# Patient Record
Sex: Female | Born: 1955 | Race: Black or African American | Hispanic: No | Marital: Single | Smoking: Former smoker
Health system: Southern US, Community
[De-identification: ages and names within clinical notes are randomized; demographics above are authoritative.]

## PROBLEM LIST (undated history)

## (undated) DIAGNOSIS — I10 Essential (primary) hypertension: Secondary | ICD-10-CM

## (undated) DIAGNOSIS — E119 Type 2 diabetes mellitus without complications: Secondary | ICD-10-CM

## (undated) DIAGNOSIS — I639 Cerebral infarction, unspecified: Secondary | ICD-10-CM

## (undated) DIAGNOSIS — N289 Disorder of kidney and ureter, unspecified: Secondary | ICD-10-CM

## (undated) DIAGNOSIS — G0490 Encephalitis and encephalomyelitis, unspecified: Secondary | ICD-10-CM

## (undated) DIAGNOSIS — H548 Legal blindness, as defined in USA: Secondary | ICD-10-CM

## (undated) DIAGNOSIS — J45909 Unspecified asthma, uncomplicated: Secondary | ICD-10-CM

## (undated) HISTORY — PX: CHOLECYSTECTOMY: SHX55

---

## 2012-04-24 ENCOUNTER — Encounter (HOSPITAL_COMMUNITY): Payer: Self-pay | Admitting: *Deleted

## 2012-04-24 ENCOUNTER — Emergency Department (HOSPITAL_COMMUNITY)
Admission: EM | Admit: 2012-04-24 | Discharge: 2012-04-24 | Disposition: A | Discharge status: Home / Self Care | Payer: Medicaid Other | Source: Home / Self Care | Attending: Family Medicine | Admitting: Family Medicine

## 2012-04-24 DIAGNOSIS — L259 Unspecified contact dermatitis, unspecified cause: Secondary | ICD-10-CM

## 2012-04-24 HISTORY — DX: Essential (primary) hypertension: I10

## 2012-04-24 HISTORY — DX: Type 2 diabetes mellitus without complications: E11.9

## 2012-04-24 HISTORY — DX: Encephalitis and encephalomyelitis, unspecified: G04.90

## 2012-04-24 HISTORY — DX: Disorder of kidney and ureter, unspecified: N28.9

## 2012-04-24 MED ORDER — FLUTICASONE PROPIONATE 0.05 % EX CREA: TOPICAL_CREAM | Freq: Two times a day (BID) | CUTANEOUS | Status: DC

## 2012-04-24 MED ORDER — HYDROXYZINE HCL 25 MG PO TABS: 25.0000 mg | ORAL_TABLET | Freq: Four times a day (QID) | ORAL | Status: DC

## 2012-04-24 NOTE — ED Provider Notes (Signed)
History     CSN: [id number]  Arrival date & time 04/24/12  0903   First MD Initiated Contact with Patient 04/24/12 [phone]      Chief Complaint  Patient presents with  . Allergic Reaction    (Consider location/radiation/quality/duration/timing/severity/associated sxs/prior treatment) Patient is a 56 y.o. female presenting with allergic reaction. The history is provided by the patient and a caregiver.  Allergic Reaction The primary symptoms are  rash. The primary symptoms do not include wheezing, shortness of breath, cough, nausea, vomiting or diarrhea. The current episode started 2 days ago. The problem has not changed since onset. The rash is associated with itching.  The onset of the reaction was associated with a new medication (use of new hair product). Significant symptoms also include itching.    Past Medical History  Diagnosis Date  . Diabetes mellitus without complication   . Hypertension     History reviewed. No pertinent past surgical history.  No family history on file.  History  Substance Use Topics  . Smoking status: Not on file  . Smokeless tobacco: Not on file  . Alcohol Use:     OB History    Grav Para Term Preterm Abortions TAB SAB Ect Mult Living                  Review of Systems  Constitutional: Negative.   HENT: Negative for congestion, trouble swallowing, neck pain and neck stiffness.   Respiratory: Negative for cough, shortness of breath and wheezing.   Gastrointestinal: Negative for nausea, vomiting and diarrhea.  Skin: Positive for itching and rash.    Allergies  Review of patient's allergies indicates no known allergies.  Home Medications   Current Outpatient Rx  Name Route Sig Dispense Refill  . CLONIDINE HCL 0.1 MG/24HR TD PTWK Transdermal Place 1 patch onto the skin once a week.    Marland Kitchen CLONIDINE HCL 0.1 MG PO TABS Oral Take 0.1 mg by mouth 2 (two) times daily.    Marland Kitchen ESCITALOPRAM OXALATE 20 MG PO TABS Oral Take 20 mg by mouth daily.     Marland Kitchen FAMOTIDINE 20 MG PO TABS Oral Take 20 mg by mouth 2 (two) times daily.    . FUROSEMIDE 40 MG PO TABS Oral Take 40 mg by mouth daily.    Marland Kitchen HYDRALAZINE HCL 50 MG PO TABS Oral Take 50 mg by mouth 3 (three) times daily.    Marland Kitchen LANTUS [place] Subcutaneous Inject into the skin.    Marland Kitchen LISINOPRIL 5 MG PO TABS Oral Take 5 mg by mouth daily.    Marland Kitchen METAPROTERENOL SULFATE PO Oral Take by mouth.    . METFORMIN HCL 500 MG PO TABS Oral Take 500 mg by mouth 2 (two) times daily with a meal.    . POTASSIMIN PO Oral Take by mouth.    Marland Kitchen PROSOURCE PO Oral Take by mouth.    Marland Kitchen ZANTAC PO Oral Take by mouth.    Marland Kitchen FLUTICASONE PROPIONATE 0.05 % EX CREA Topical Apply topically 2 (two) times daily. To rash and eyelids. 30 g 1  . HYDROXYZINE HCL 25 MG PO TABS Oral Take 1 tablet (25 mg total) by mouth every 6 (six) hours. 20 tablet 0    BP 149/75  Pulse 90  Temp 97.6 F (36.4 C) (Oral)  Resp 20  SpO2 100%  Physical Exam  Nursing note and vitals reviewed. Constitutional: She appears well-developed and well-nourished. No distress.  HENT:  Head: Normocephalic.  Mouth/Throat: Oropharynx is clear  and moist.  Eyes: Conjunctivae normal are normal. Pupils are equal, round, and reactive to light.       bilat upper and lower eyelid sts, legally blind, nontender, no drainage.or erythema   Neck: Normal range of motion. Neck supple.  Cardiovascular: Normal rate, regular rhythm, normal heart sounds and intact distal pulses.   Pulmonary/Chest: Breath sounds normal.  Neurological: She is alert.  Skin: Skin is warm and dry.    ED Course  Procedures (including critical care time)  Labs Reviewed - No data to display No results found.   1. Contact dermatitis       MDM          Linna Hoff, MD 04/24/12 [phone]

## 2012-04-24 NOTE — ED Notes (Signed)
Pt  Reports     Had  Application  Of  Hair  Dye   2  Days  Ago  With  Facial  Edema   And  Swelling  Around  Eyes   Sister  Washed  Her  Hair  Out  Well      Gave  Her  Benadryl  Yet  Swelling  Continues            Shairway  Intact    No  resp  Distress  Pt  Is  Blind

## 2012-05-03 ENCOUNTER — Encounter (HOSPITAL_COMMUNITY): Payer: Self-pay

## 2012-05-03 ENCOUNTER — Emergency Department (HOSPITAL_COMMUNITY)
Admission: EM | Admit: 2012-05-03 | Discharge: 2012-05-03 | Disposition: A | Discharge status: Home / Self Care | Payer: Medicaid Other | Attending: Emergency Medicine | Admitting: Emergency Medicine

## 2012-05-03 DIAGNOSIS — Z794 Long term (current) use of insulin: Secondary | ICD-10-CM | POA: Insufficient documentation

## 2012-05-03 DIAGNOSIS — Z79899 Other long term (current) drug therapy: Secondary | ICD-10-CM | POA: Insufficient documentation

## 2012-05-03 DIAGNOSIS — Z8679 Personal history of other diseases of the circulatory system: Secondary | ICD-10-CM | POA: Insufficient documentation

## 2012-05-03 DIAGNOSIS — I129 Hypertensive chronic kidney disease with stage 1 through stage 4 chronic kidney disease, or unspecified chronic kidney disease: Secondary | ICD-10-CM | POA: Insufficient documentation

## 2012-05-03 DIAGNOSIS — Z87448 Personal history of other diseases of urinary system: Secondary | ICD-10-CM | POA: Insufficient documentation

## 2012-05-03 DIAGNOSIS — E119 Type 2 diabetes mellitus without complications: Secondary | ICD-10-CM | POA: Insufficient documentation

## 2012-05-03 DIAGNOSIS — L299 Pruritus, unspecified: Secondary | ICD-10-CM | POA: Insufficient documentation

## 2012-05-03 MED ORDER — FAMOTIDINE 20 MG PO TABS: 20.0000 mg | ORAL_TABLET | Freq: Two times a day (BID) | ORAL | Status: DC

## 2012-05-03 MED ORDER — DIPHENHYDRAMINE HCL 25 MG PO TABS: 25.0000 mg | ORAL_TABLET | Freq: Four times a day (QID) | ORAL | Status: DC

## 2012-05-03 MED ORDER — DIPHENHYDRAMINE HCL 50 MG/ML IJ SOLN
25.0000 mg | Freq: Once | INTRAMUSCULAR | Status: AC
  Administered 2012-05-03: 25 mg via INTRAMUSCULAR
  Filled 2012-05-03: qty 1

## 2012-05-03 MED ORDER — FAMOTIDINE 20 MG PO TABS
20.0000 mg | ORAL_TABLET | Freq: Once | ORAL | Status: AC
  Administered 2012-05-03: 20 mg via ORAL
  Filled 2012-05-03: qty 1

## 2012-05-03 NOTE — ED Notes (Signed)
Patient c/o rash from an allergic reaction from hair dye on 04/24/12. Patient was seen in the ED for the same and was prescribed fluticasone and atarax. Patient states she has been itching all over since. Patient continues to have slight facial swelling. Patient last took meds at 0830.

## 2012-05-03 NOTE — ED Provider Notes (Signed)
Medical screening examination/treatment/procedure(s) were performed by non-physician practitioner and as supervising physician I was immediately available for consultation/collaboration.   Marcelia Petersen, MD 05/03/12 1447 

## 2012-05-03 NOTE — ED Provider Notes (Signed)
History     CSN: [id number]  Arrival date & time 05/03/12  1123   First MD Initiated Contact with Patient 05/03/12 1214      Chief Complaint  Patient presents with  . Rash    (Consider location/radiation/quality/duration/timing/severity/associated sxs/prior treatment) HPI Comments: Patient is a a 56 year old female who presents with rash for the past week. The rash started gradually after using hair dye last week that precipitated allergic reaction which involved facial swelling and itching. Patient was seen at urgent care after the reaction where she was prescribed Atarax and fluticasone cream. The rash has remained constant since the onset. The rash is located all over her body. Patient has tried Aveeno body wash without relief. Patient denies new exposures to medications, soaps, lotions, detergent. Patient reports associated itching. No aggravating/alleviating factors. Patient denies fever, chills, NVD, sore throat, oral lesions, ocular involvement, throat closing, wheezing, SOB, chest pain, abdominal pain.     Patient is a 56 y.o. female presenting with rash.  Rash     Past Medical History  Diagnosis Date  . Diabetes mellitus without complication   . Hypertension   . Renal disease   . Encephalitis     History reviewed. No pertinent past surgical history.  History reviewed. No pertinent family history.  History  Substance Use Topics  . Smoking status: Former Games developer  . Smokeless tobacco: Never Used  . Alcohol Use: No    OB History    Grav Para Term Preterm Abortions TAB SAB Ect Mult Living                  Review of Systems  Skin: Positive for rash.  All other systems reviewed and are negative.    Allergies  Review of patient's allergies indicates no known allergies.  Home Medications   Current Outpatient Rx  Name Route Sig Dispense Refill  . CLONIDINE HCL 0.1 MG PO TABS Oral Take 0.1 mg by mouth 2 (two) times daily.    Marland Kitchen DICYCLOMINE HCL 20 MG PO TABS  Oral Take 20 mg by mouth every 6 (six) hours.    Marland Kitchen FLUTICASONE PROPIONATE 0.05 % EX CREA Topical Apply topically 2 (two) times daily. To rash and eyelids. 30 g 1  . FUROSEMIDE 40 MG PO TABS Oral Take 40 mg by mouth daily.    Marland Kitchen HYDRALAZINE HCL 25 MG PO TABS Oral Take 50 mg by mouth 3 (three) times daily.    Marland Kitchen HYDROXYZINE HCL 25 MG PO TABS Oral Take 1 tablet (25 mg total) by mouth every 6 (six) hours. 20 tablet 0  . LANTUS [place] Subcutaneous Inject 40 Units into the skin 2 (two) times daily.     . INSULIN LISPRO (HUMAN) 100 UNIT/ML [place] SOLN Subcutaneous Inject 4-6 Units into the skin 3 (three) times daily before meals.    Marland Kitchen METFORMIN HCL 500 MG PO TABS Oral Take 500 mg by mouth 2 (two) times daily with a meal.    . METOPROLOL TARTRATE 50 MG PO TABS Oral Take 50 mg by mouth 2 (two) times daily.    Marland Kitchen OMEPRAZOLE 40 MG PO CPDR Oral Take 40 mg by mouth daily.    Marland Kitchen POTASSIMIN PO Oral Take 20 mEq by mouth daily.       BP 140/73  Pulse 57  Temp 97.8 F (36.6 C) (Oral)  Resp 18  SpO2 99%  LMP 05/03/2012  Physical Exam  Nursing note and vitals reviewed. Constitutional: She is oriented to person,  place, and time. She appears well-developed and well-nourished. No distress.  HENT:  Head: Normocephalic and atraumatic.  Mouth/Throat: Oropharynx is clear and moist. No oropharyngeal exudate.  Eyes: Conjunctivae normal and EOM are normal. Pupils are equal, round, and reactive to light.  Neck: Normal range of motion. Neck supple.  Cardiovascular: Normal rate and regular rhythm.  Exam reveals no gallop and no friction rub.   No murmur heard. Pulmonary/Chest: Effort normal and breath sounds normal. She has no wheezes. She has no rales. She exhibits no tenderness.  Abdominal: Soft. She exhibits no distension. There is no tenderness.       No RUQ tenderness.   Musculoskeletal: Normal range of motion.  Neurological: She is alert and oriented to person, place, and time. Coordination normal.       Speech is  goal-oriented. Moves limbs without ataxia.   Skin: Skin is warm and dry. She is not diaphoretic.       No rash noted on body but patient has generalized dry skin and excoriations to arms, legs, back and stomach.   Psychiatric: She has a normal mood and affect. Her behavior is normal.    ED Course  Procedures (including critical care time)  Labs Reviewed - No data to display No results found.   1. Pruritus       MDM  1:27 PM Patient likely having continued generalized itching from hair dye exposure that she had an allergic reaction to one week ago. I will discharge her with benadryl and pepcid to take for the itching. Patient advised to follow up with PCP to have LFTs. Patient agreeable to plan. No wheezing, SOB, throat closing noted. No further evaluation needed at this time.        Emilia Beck, PA-C 05/03/12 1337

## 2012-05-11 ENCOUNTER — Encounter (HOSPITAL_COMMUNITY): Payer: Self-pay | Admitting: *Deleted

## 2012-05-11 ENCOUNTER — Emergency Department (HOSPITAL_COMMUNITY)
Admission: EM | Admit: 2012-05-11 | Discharge: 2012-05-11 | Disposition: A | Discharge status: Home / Self Care | Payer: Medicaid Other | Attending: Emergency Medicine | Admitting: Emergency Medicine

## 2012-05-11 DIAGNOSIS — E119 Type 2 diabetes mellitus without complications: Secondary | ICD-10-CM | POA: Insufficient documentation

## 2012-05-11 DIAGNOSIS — Z79899 Other long term (current) drug therapy: Secondary | ICD-10-CM | POA: Insufficient documentation

## 2012-05-11 DIAGNOSIS — I10 Essential (primary) hypertension: Secondary | ICD-10-CM | POA: Insufficient documentation

## 2012-05-11 DIAGNOSIS — L299 Pruritus, unspecified: Secondary | ICD-10-CM | POA: Insufficient documentation

## 2012-05-11 DIAGNOSIS — Z87891 Personal history of nicotine dependence: Secondary | ICD-10-CM | POA: Insufficient documentation

## 2012-05-11 DIAGNOSIS — Z794 Long term (current) use of insulin: Secondary | ICD-10-CM | POA: Insufficient documentation

## 2012-05-11 MED ORDER — HYDROXYZINE PAMOATE 25 MG PO CAPS: 25.0000 mg | ORAL_CAPSULE | Freq: Three times a day (TID) | ORAL | Status: DC | PRN

## 2012-05-11 MED ORDER — HYDROXYZINE HCL 25 MG PO TABS
25.0000 mg | ORAL_TABLET | Freq: Once | ORAL | Status: AC
  Administered 2012-05-11: 25 mg via ORAL
  Filled 2012-05-11: qty 1

## 2012-05-11 NOTE — ED Provider Notes (Signed)
History     CSN: [id number]  Arrival date & time 05/11/12  0244   First MD Initiated Contact with Patient 05/11/12 0255      Chief Complaint  Patient presents with  . Pruritis    (Consider location/radiation/quality/duration/timing/severity/associated sxs/prior treatment) The history is provided by the patient and a relative. No language interpreter was used.   Cc: 56 year-old female coming in today with complaint of puritis. Patient had allergic reaction to hair dye on 10:15. Since then she has been seen twice for chronic itching. States that the Benadryl and lotions or not working anymore. States that the Vistaril works a little better than the Benadryl requesting another prescription for Vistaril. No visible rash.  Sister states that the swelling around her eyes has improved.     Past Medical History  Diagnosis Date  . Diabetes mellitus without complication   . Hypertension   . Renal disease   . Encephalitis     History reviewed. No pertinent past surgical history.  No family history on file.  History  Substance Use Topics  . Smoking status: Former Games developer  . Smokeless tobacco: Never Used  . Alcohol Use: No    OB History    Grav Para Term Preterm Abortions TAB SAB Ect Mult Living                  Review of Systems  HENT: Negative for facial swelling and neck pain.   Eyes: Negative for visual disturbance.  Cardiovascular: Negative for chest pain.  Gastrointestinal: Negative for nausea, vomiting and abdominal pain.  Musculoskeletal: Negative for back pain and gait problem.  Skin: Negative for wound.  Neurological: Negative for dizziness, weakness, numbness and headaches.  All other systems reviewed and are negative.    Allergies  Review of patient's allergies indicates no known allergies.  Home Medications   Current Outpatient Rx  Name Route Sig Dispense Refill  . CLONIDINE HCL 0.1 MG PO TABS Oral Take 0.1 mg by mouth 2 (two) times daily.    Marland Kitchen  DICYCLOMINE HCL 20 MG PO TABS Oral Take 20 mg by mouth every 6 (six) hours.    Marland Kitchen DIPHENHYDRAMINE HCL 25 MG PO TABS Oral Take 1 tablet (25 mg total) by mouth every 6 (six) hours. 20 tablet 0  . INSULIN GLARGINE 100 UNIT/ML [place] SOLN Subcutaneous Inject 40 Units into the skin 2 (two) times daily.    Marland Kitchen POTASSIUM CHLORIDE [place] ER 20 MEQ PO TBCR Oral Take 20 mEq by mouth daily.    Marland Kitchen FAMOTIDINE 20 MG PO TABS Oral Take 1 tablet (20 mg total) by mouth 2 (two) times daily. 30 tablet 0  . FLUTICASONE PROPIONATE 0.05 % EX CREA Topical Apply topically 2 (two) times daily. To rash and eyelids. 30 g 1  . FUROSEMIDE 40 MG PO TABS Oral Take 40 mg by mouth daily.    Marland Kitchen HYDRALAZINE HCL 25 MG PO TABS Oral Take 50 mg by mouth 3 (three) times daily.    Marland Kitchen HYDROXYZINE HCL 25 MG PO TABS Oral Take 1 tablet (25 mg total) by mouth every 6 (six) hours. 20 tablet 0  . INSULIN LISPRO (HUMAN) 100 UNIT/ML [place] SOLN Subcutaneous Inject 4-6 Units into the skin 3 (three) times daily before meals.    Marland Kitchen METFORMIN HCL 500 MG PO TABS Oral Take 500 mg by mouth 2 (two) times daily with a meal.    . METOPROLOL TARTRATE 50 MG PO TABS Oral Take 50 mg by mouth  2 (two) times daily.    Marland Kitchen OMEPRAZOLE 40 MG PO CPDR Oral Take 40 mg by mouth daily.      BP 137/69  Pulse 68  Temp 97.9 F (36.6 C)  Resp 20  Ht 5\' 1"  (1.549 m)  Wt 208 lb (94.348 kg)  BMI 39.30 kg/m2  SpO2 95%  LMP 05/03/2012  Physical Exam  Nursing note and vitals reviewed. Constitutional: She is oriented to person, place, and time. She appears well-developed and well-nourished.  HENT:  Head: Normocephalic and atraumatic.  Eyes: Conjunctivae normal and EOM are normal. Pupils are equal, round, and reactive to light.  Neck: Normal range of motion. Neck supple.  Cardiovascular: Normal rate.   Pulmonary/Chest: Effort normal.  Abdominal: Soft.  Musculoskeletal: Normal range of motion. She exhibits no edema and no tenderness.  Neurological: She is alert and oriented to person,  place, and time. She has normal reflexes.  Skin: Skin is warm and dry. No rash noted.       pruritis  Psychiatric: She has a normal mood and affect.    ED Course  Procedures (including critical care time)  Labs Reviewed - No data to display No results found.   No diagnosis found.    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 Out of her  Vistaril. No visible rash. Prescription for Vistaril and followup with Dr. Margo Aye dermatology tomorrow if possible. Patient will followup with her PCP on the seventh. No acute distress.          Remi Haggard, NP 05/11/12 [phone]

## 2012-05-11 NOTE — ED Notes (Signed)
Pt had an allergic reaction to hair dye on 10/11; treated on 10/13; got a little better but since then has continued with all over body itching; treated for same on 10/24 with cream; continues with itching

## 2012-05-12 NOTE — ED Provider Notes (Signed)
Medical screening examination/treatment/procedure(s) were performed by non-physician practitioner and as supervising physician I was immediately available for consultation/collaboration.    Vida Roller, MD 05/12/12 [phone]

## 2014-07-23 ENCOUNTER — Other Ambulatory Visit: Payer: Self-pay | Admitting: Family Medicine

## 2014-07-23 DIAGNOSIS — Z1231 Encounter for screening mammogram for malignant neoplasm of breast: Secondary | ICD-10-CM

## 2014-08-05 ENCOUNTER — Ambulatory Visit: Payer: Medicaid Other

## 2014-08-19 ENCOUNTER — Ambulatory Visit
Admission: RE | Admit: 2014-08-19 | Discharge: 2014-08-19 | Disposition: A | Discharge status: Home / Self Care | Payer: No Typology Code available for payment source | Source: Ambulatory Visit | Attending: Family Medicine | Admitting: Family Medicine

## 2014-08-19 DIAGNOSIS — Z1231 Encounter for screening mammogram for malignant neoplasm of breast: Secondary | ICD-10-CM

## 2016-09-21 ENCOUNTER — Ambulatory Visit (INDEPENDENT_AMBULATORY_CARE_PROVIDER_SITE_OTHER): Payer: Medicare Other | Admitting: Sports Medicine

## 2016-09-21 ENCOUNTER — Encounter: Payer: Self-pay | Admitting: Sports Medicine

## 2016-09-21 ENCOUNTER — Ambulatory Visit: Payer: Medicare Other | Admitting: Podiatry

## 2016-09-21 DIAGNOSIS — E119 Type 2 diabetes mellitus without complications: Secondary | ICD-10-CM | POA: Diagnosis not present

## 2016-09-21 DIAGNOSIS — B351 Tinea unguium: Secondary | ICD-10-CM

## 2016-09-21 DIAGNOSIS — M79676 Pain in unspecified toe(s): Secondary | ICD-10-CM | POA: Diagnosis not present

## 2016-09-21 NOTE — Patient Instructions (Signed)

## 2016-09-21 NOTE — Progress Notes (Signed)
Subjective: Kristen Dennis is a 61 y.o. female patient with history of diabetes who presents to office today complaining of long, painful nails  while ambulating in shoes; unable to trim. Patient states that the glucose reading this morning was 87 mg/dl [facility]. Patient denies any new changes in medication or new problems. Patient denies any new cramping, numbness, burning or tingling in the legs.  There are no active problems to display for this patient.  Current Outpatient Prescriptions on File Prior to Visit  Medication Sig Dispense Refill  . cloNIDine (CATAPRES) 0.1 MG tablet Take 0.1 mg by mouth 2 (two) times daily.    Marland Kitchen. dicyclomine (BENTYL) 20 MG tablet Take 20 mg by mouth every 6 (six) hours.    . diphenhydrAMINE (BENADRYL) 25 MG tablet Take 1 tablet (25 mg total) by mouth every 6 (six) hours. 20 tablet 0  . famotidine (PEPCID) 20 MG tablet Take 1 tablet (20 mg total) by mouth 2 (two) times daily. 30 tablet 0  . fluticasone (CUTIVATE) 0.05 % cream Apply topically 2 (two) times daily. To rash and eyelids. 30 g 1  . furosemide (LASIX) 40 MG tablet Take 40 mg by mouth daily.    . hydrALAZINE (APRESOLINE) 25 MG tablet Take 50 mg by mouth 3 (three) times daily.    . hydrOXYzine (ATARAX/VISTARIL) 25 MG tablet Take 1 tablet (25 mg total) by mouth every 6 (six) hours. 20 tablet 0  . hydrOXYzine (VISTARIL) 25 MG capsule Take 1 capsule (25 mg total) by mouth 3 (three) times daily as needed for itching. 30 capsule 0  . insulin glargine (LANTUS) 100 UNIT/ML injection Inject 40 Units into the skin 2 (two) times daily.    . insulin lispro (HUMALOG) 100 UNIT/ML injection Inject 4-6 Units into the skin 3 (three) times daily before meals.    . metFORMIN (GLUCOPHAGE) 500 MG tablet Take 500 mg by mouth 2 (two) times daily with a meal.    . metoprolol (LOPRESSOR) 50 MG tablet Take 50 mg by mouth 2 (two) times daily.    Marland Kitchen. omeprazole (PRILOSEC) 40 MG capsule Take 40 mg by mouth  daily.    . potassium chloride SA (K-DUR,KLOR-CON) 20 MEQ tablet Take 20 mEq by mouth daily.     No current facility-administered medications on file prior to visit.    No Known Allergies  No results found for this or any previous visit (from the past 2160 hour(s)).  Objective: General: Patient is awake, alert, and oriented x 3 and in no acute distress.  Integument: Skin is warm, dry and supple bilateral. Nails are tender, long, thickened and dystrophic with subungual debris, consistent with onychomycosis, 1-5 bilateral. No signs of infection. No open lesions or preulcerative lesions present bilateral. Remaining integument unremarkable.  Vasculature:  Dorsalis Pedis pulse 1/4 bilateral. Posterior Tibial pulse  1/4 bilateral. Capillary fill time <3 sec 1-5 bilateral. Scant hair growth to the level of the digits.Temperature gradient within normal limits. No varicosities present bilateral. No edema present bilateral.   Neurology: The patient has intact sensation measured with a 5.07/10g Semmes Weinstein Monofilament at all pedal sites bilateral . Vibratory sensation diminished bilateral with tuning fork. No Babinski sign present bilateral.   Musculoskeletal: No symptomatic pedal deformities noted bilateral. Muscular strength 5/5 in all lower extremity muscular groups bilateral without pain on range of motion . No tenderness with calf compression bilateral.  Assessment and Plan: Problem List Items Addressed This Visit    None  Visit Diagnoses    Pain due to onychomycosis of toenail    -  Primary   Diabetes mellitus without complication (HCC)          -Examined patient. -Discussed and educated patient on diabetic foot care, especially with  regards to the vascular, neurological and musculoskeletal systems.  -Stressed the importance of good glycemic control and the detriment of not controlling glucose levels in relation to the foot. -Mechanically debrided all nails 1-5 bilateral using  sterile nail nipper and filed with dremel without incident  -Continue with wheelchair assistance gait  -Answered all patient questions -Patient to return in 3 months for at risk foot care -Patient advised to call the office if any problems or questions arise in the meantime.  Asencion Islam, DPM

## 2016-12-21 ENCOUNTER — Ambulatory Visit: Payer: Medicare Other | Admitting: Sports Medicine

## 2017-07-15 ENCOUNTER — Encounter (HOSPITAL_COMMUNITY): Payer: Self-pay

## 2017-07-15 ENCOUNTER — Other Ambulatory Visit: Payer: Self-pay

## 2017-07-15 ENCOUNTER — Inpatient Hospital Stay (HOSPITAL_COMMUNITY)
Admission: EM | Admit: 2017-07-15 | Discharge: 2017-07-18 | DRG: 202 | Disposition: A | Discharge status: Home Health | Payer: Medicare Other | Attending: Nephrology | Admitting: Nephrology

## 2017-07-15 ENCOUNTER — Emergency Department (HOSPITAL_COMMUNITY): Payer: Medicare Other

## 2017-07-15 DIAGNOSIS — Z9049 Acquired absence of other specified parts of digestive tract: Secondary | ICD-10-CM

## 2017-07-15 DIAGNOSIS — Z6841 Body Mass Index (BMI) 40.0 and over, adult: Secondary | ICD-10-CM

## 2017-07-15 DIAGNOSIS — N289 Disorder of kidney and ureter, unspecified: Secondary | ICD-10-CM

## 2017-07-15 DIAGNOSIS — E1165 Type 2 diabetes mellitus with hyperglycemia: Secondary | ICD-10-CM | POA: Diagnosis present

## 2017-07-15 DIAGNOSIS — Z79899 Other long term (current) drug therapy: Secondary | ICD-10-CM

## 2017-07-15 DIAGNOSIS — E119 Type 2 diabetes mellitus without complications: Secondary | ICD-10-CM | POA: Diagnosis not present

## 2017-07-15 DIAGNOSIS — Z794 Long term (current) use of insulin: Secondary | ICD-10-CM

## 2017-07-15 DIAGNOSIS — J4531 Mild persistent asthma with (acute) exacerbation: Principal | ICD-10-CM

## 2017-07-15 DIAGNOSIS — J45901 Unspecified asthma with (acute) exacerbation: Secondary | ICD-10-CM | POA: Diagnosis present

## 2017-07-15 DIAGNOSIS — I10 Essential (primary) hypertension: Secondary | ICD-10-CM | POA: Diagnosis not present

## 2017-07-15 DIAGNOSIS — J40 Bronchitis, not specified as acute or chronic: Secondary | ICD-10-CM

## 2017-07-15 DIAGNOSIS — E669 Obesity, unspecified: Secondary | ICD-10-CM | POA: Diagnosis present

## 2017-07-15 DIAGNOSIS — J45909 Unspecified asthma, uncomplicated: Secondary | ICD-10-CM | POA: Diagnosis present

## 2017-07-15 DIAGNOSIS — T380X5A Adverse effect of glucocorticoids and synthetic analogues, initial encounter: Secondary | ICD-10-CM | POA: Diagnosis present

## 2017-07-15 DIAGNOSIS — E1159 Type 2 diabetes mellitus with other circulatory complications: Secondary | ICD-10-CM | POA: Diagnosis present

## 2017-07-15 DIAGNOSIS — R0902 Hypoxemia: Secondary | ICD-10-CM | POA: Diagnosis present

## 2017-07-15 DIAGNOSIS — I152 Hypertension secondary to endocrine disorders: Secondary | ICD-10-CM | POA: Diagnosis present

## 2017-07-15 DIAGNOSIS — Z87891 Personal history of nicotine dependence: Secondary | ICD-10-CM

## 2017-07-15 HISTORY — DX: Unspecified asthma, uncomplicated: J45.909

## 2017-07-15 HISTORY — DX: Cerebral infarction, unspecified: I63.9

## 2017-07-15 LAB — BASIC METABOLIC PANEL
ANION GAP: 9 (ref 5–15)
BUN: 10 mg/dL (ref 6–20)
CHLORIDE: 103 mmol/L (ref 101–111)
CO2: 25 mmol/L (ref 22–32)
CREATININE: 0.92 mg/dL (ref 0.44–1.00)
Calcium: 8.9 mg/dL (ref 8.9–10.3)
GFR calc non Af Amer: >60 mL/min (ref >60)
Glucose, Bld: 105 mg/dL — ABNORMAL HIGH (ref 65–99)
POTASSIUM: 4.7 mmol/L (ref 3.5–5.1)
SODIUM: 137 mmol/L (ref 135–145)

## 2017-07-15 LAB — GLUCOSE, CAPILLARY: GLUCOSE-CAPILLARY: 253 mg/dL — AB (ref 65–99)

## 2017-07-15 LAB — CBC
HEMATOCRIT: 42.9 % (ref 36.0–46.0)
Hemoglobin: 13.9 g/dL (ref 12.0–15.0)
MCH: 27.4 pg (ref 26.0–34.0)
MCHC: 32.4 g/dL (ref 30.0–36.0)
MCV: 84.6 fL (ref 78.0–100.0)
Platelets: 252 10*3/uL (ref 150–400)
RBC: 5.07 MIL/uL (ref 3.87–5.11)
RDW: 16.1 % — AB (ref 11.5–15.5)
WBC: 8.8 10*3/uL (ref 4.0–10.5)

## 2017-07-15 MED ORDER — SODIUM CHLORIDE 0.9% FLUSH
3.0000 mL | Freq: Two times a day (BID) | INTRAVENOUS | Status: DC
  Administered 2017-07-16 (×3): 3 mL via INTRAVENOUS
  Administered 2017-07-17: 23:00:00 via INTRAVENOUS
  Administered 2017-07-18: 10:00:00 3 mL via INTRAVENOUS

## 2017-07-15 MED ORDER — IPRATROPIUM-ALBUTEROL 0.5-2.5 (3) MG/3ML IN SOLN
3.0000 mL | Freq: Four times a day (QID) | RESPIRATORY_TRACT | Status: DC
  Administered 2017-07-16 – 2017-07-17 (×8): 3 mL via RESPIRATORY_TRACT
  Filled 2017-07-15 (×8): qty 3

## 2017-07-15 MED ORDER — AMLODIPINE BESYLATE 5 MG PO TABS
5.0000 mg | ORAL_TABLET | Freq: Every day | ORAL | Status: DC
  Administered 2017-07-16 – 2017-07-18 (×3): 5 mg via ORAL
  Filled 2017-07-15 (×3): qty 1

## 2017-07-15 MED ORDER — SODIUM CHLORIDE 0.9 % IV SOLN: 250.0000 mL | INTRAVENOUS | Status: DC | PRN

## 2017-07-15 MED ORDER — LISINOPRIL 10 MG PO TABS
10.0000 mg | ORAL_TABLET | Freq: Every day | ORAL | Status: DC
  Administered 2017-07-16 – 2017-07-17 (×2): 10 mg via ORAL
  Filled 2017-07-15 (×2): qty 1

## 2017-07-15 MED ORDER — GUAIFENESIN ER 600 MG PO TB12
600.0000 mg | ORAL_TABLET | Freq: Two times a day (BID) | ORAL | Status: DC
  Administered 2017-07-16 – 2017-07-18 (×6): 600 mg via ORAL
  Filled 2017-07-15 (×6): qty 1

## 2017-07-15 MED ORDER — IPRATROPIUM BROMIDE 0.02 % IN SOLN
0.5000 mg | Freq: Once | RESPIRATORY_TRACT | Status: AC
  Administered 2017-07-15: 0.5 mg via RESPIRATORY_TRACT
  Filled 2017-07-15: qty 2.5

## 2017-07-15 MED ORDER — PREDNISONE 20 MG PO TABS
60.0000 mg | ORAL_TABLET | Freq: Once | ORAL | Status: AC
  Administered 2017-07-15: 60 mg via ORAL
  Filled 2017-07-15: qty 3

## 2017-07-15 MED ORDER — FUROSEMIDE 40 MG PO TABS
40.0000 mg | ORAL_TABLET | Freq: Every day | ORAL | Status: DC
  Administered 2017-07-16 – 2017-07-18 (×3): 40 mg via ORAL
  Filled 2017-07-15 (×3): qty 1

## 2017-07-15 MED ORDER — HYDRALAZINE HCL 50 MG PO TABS
50.0000 mg | ORAL_TABLET | Freq: Three times a day (TID) | ORAL | Status: DC
  Administered 2017-07-16 – 2017-07-18 (×6): 50 mg via ORAL
  Filled 2017-07-15 (×5): qty 1

## 2017-07-15 MED ORDER — ALBUTEROL SULFATE (2.5 MG/3ML) 0.083% IN NEBU
2.5000 mg | INHALATION_SOLUTION | RESPIRATORY_TRACT | Status: DC | PRN
  Administered 2017-07-16: 22:00:00 2.5 mg via RESPIRATORY_TRACT
  Filled 2017-07-15: qty 3

## 2017-07-15 MED ORDER — DULOXETINE HCL 30 MG PO CPEP
60.0000 mg | ORAL_CAPSULE | Freq: Every day | ORAL | Status: DC
  Administered 2017-07-16 – 2017-07-17 (×3): 60 mg via ORAL
  Filled 2017-07-15 (×3): qty 2

## 2017-07-15 MED ORDER — METOPROLOL TARTRATE 25 MG PO TABS
50.0000 mg | ORAL_TABLET | Freq: Two times a day (BID) | ORAL | Status: DC
  Administered 2017-07-16 – 2017-07-18 (×5): 50 mg via ORAL
  Filled 2017-07-15 (×5): qty 2

## 2017-07-15 MED ORDER — PREGABALIN 50 MG PO CAPS
100.0000 mg | ORAL_CAPSULE | Freq: Three times a day (TID) | ORAL | Status: DC
  Administered 2017-07-16 – 2017-07-18 (×8): 100 mg via ORAL
  Filled 2017-07-15 (×8): qty 2

## 2017-07-15 MED ORDER — MONTELUKAST SODIUM 10 MG PO TABS
10.0000 mg | ORAL_TABLET | Freq: Every day | ORAL | Status: DC
  Administered 2017-07-16 – 2017-07-17 (×3): 10 mg via ORAL
  Filled 2017-07-15 (×3): qty 1

## 2017-07-15 MED ORDER — ALBUTEROL SULFATE (2.5 MG/3ML) 0.083% IN NEBU
5.0000 mg | INHALATION_SOLUTION | Freq: Once | RESPIRATORY_TRACT | Status: AC
  Administered 2017-07-15: 5 mg via RESPIRATORY_TRACT
  Filled 2017-07-15: qty 6

## 2017-07-15 MED ORDER — IPRATROPIUM BROMIDE 0.02 % IN SOLN: 0.5000 mg | Freq: Four times a day (QID) | RESPIRATORY_TRACT | Status: DC

## 2017-07-15 MED ORDER — ALBUTEROL SULFATE (2.5 MG/3ML) 0.083% IN NEBU
2.5000 mg | INHALATION_SOLUTION | Freq: Once | RESPIRATORY_TRACT | Status: AC
  Administered 2017-07-15: 2.5 mg via RESPIRATORY_TRACT
  Filled 2017-07-15: qty 3

## 2017-07-15 MED ORDER — SODIUM CHLORIDE 0.9% FLUSH: 3.0000 mL | INTRAVENOUS | Status: DC | PRN

## 2017-07-15 MED ORDER — ALBUTEROL (5 MG/ML) CONTINUOUS INHALATION SOLN
10.0000 mg/h | INHALATION_SOLUTION | Freq: Once | RESPIRATORY_TRACT | Status: AC
Start: 2017-07-15 — End: 2017-07-15
  Administered 2017-07-15: 10 mg/h via RESPIRATORY_TRACT
  Filled 2017-07-15: qty 20

## 2017-07-15 MED ORDER — AZITHROMYCIN 250 MG PO TABS
250.0000 mg | ORAL_TABLET | Freq: Every day | ORAL | Status: DC
  Administered 2017-07-16 – 2017-07-17 (×2): 250 mg via ORAL
  Filled 2017-07-15 (×2): qty 1

## 2017-07-15 MED ORDER — ALBUTEROL SULFATE (2.5 MG/3ML) 0.083% IN NEBU: 2.5000 mg | INHALATION_SOLUTION | Freq: Four times a day (QID) | RESPIRATORY_TRACT | Status: DC

## 2017-07-15 MED ORDER — INSULIN GLARGINE 100 UNIT/ML ~~LOC~~ SOLN
55.0000 [IU] | Freq: Every day | SUBCUTANEOUS | Status: DC
  Administered 2017-07-16 – 2017-07-17 (×3): 55 [IU] via SUBCUTANEOUS
  Filled 2017-07-15 (×4): qty 0.55

## 2017-07-15 MED ORDER — METHYLPREDNISOLONE SODIUM SUCC 125 MG IJ SOLR
80.0000 mg | Freq: Two times a day (BID) | INTRAMUSCULAR | Status: DC
  Administered 2017-07-16 – 2017-07-18 (×6): 80 mg via INTRAVENOUS
  Filled 2017-07-15 (×6): qty 2

## 2017-07-15 MED ORDER — AZITHROMYCIN 250 MG PO TABS
500.0000 mg | ORAL_TABLET | ORAL | Status: AC
  Administered 2017-07-16: 01:00:00 500 mg via ORAL
  Filled 2017-07-15: qty 2

## 2017-07-15 MED ORDER — INSULIN ASPART 100 UNIT/ML ~~LOC~~ SOLN
0.0000 [IU] | Freq: Three times a day (TID) | SUBCUTANEOUS | Status: DC
Start: 2017-07-16 — End: 2017-07-18
  Administered 2017-07-16: 7 [IU] via SUBCUTANEOUS
  Administered 2017-07-16: 09:00:00 5 [IU] via SUBCUTANEOUS
  Administered 2017-07-16: 7 [IU] via SUBCUTANEOUS
  Administered 2017-07-17: 13:00:00 5 [IU] via SUBCUTANEOUS
  Administered 2017-07-17: 17:00:00 7 [IU] via SUBCUTANEOUS
  Administered 2017-07-17: 3 [IU] via SUBCUTANEOUS
  Administered 2017-07-18 (×2): 5 [IU] via SUBCUTANEOUS

## 2017-07-15 MED ORDER — DONEPEZIL HCL 5 MG PO TABS
5.0000 mg | ORAL_TABLET | Freq: Every day | ORAL | Status: DC
  Administered 2017-07-16 – 2017-07-17 (×3): 5 mg via ORAL
  Filled 2017-07-15 (×3): qty 1

## 2017-07-15 MED ORDER — POTASSIUM CHLORIDE CRYS ER 20 MEQ PO TBCR
20.0000 meq | EXTENDED_RELEASE_TABLET | Freq: Every day | ORAL | Status: DC
  Administered 2017-07-16 – 2017-07-18 (×2): 20 meq via ORAL
  Filled 2017-07-15 (×3): qty 1

## 2017-07-15 NOTE — H&P (Signed)
History and Physical    Kristen Dennis FAO:[id number] DOB: Feb 07, 1956 DOA: 07/15/2017  PCP: Claudean Severance, MD  Patient coming from:  home  Chief Complaint:  Wheezing, sob  HPI: Kristen Dennis is a 62 y.o. female with medical history significant of asthma, dm, htn comes in with a week of worsening sob and wheezing not getting better at home with her expired albuterol inhaler.  She has been having nasal congestion and recent cold.  No fevers.  No le edema or swelling.  No chest pain.  Pt given several nebs in the ED and still wheezing.  On ambulation oxygen sats are 88% on RA.  Review of Systems: As per HPI otherwise 10 point review of systems negative.   Past Medical History:  Diagnosis Date  . Asthma   . Diabetes mellitus without complication (HCC)   . Encephalitis   . Hypertension   . Renal disease     Past Surgical History:  Procedure Laterality Date  . CHOLECYSTECTOMY       reports that she has quit smoking. she has never used smokeless tobacco. She reports that she does not drink alcohol or use drugs.  No Known Allergies  History reviewed. No pertinent family history.  No premature CAD  Prior to Admission medications   Medication Sig Start Date End Date Taking? Authorizing Provider  cloNIDine (CATAPRES) 0.1 MG tablet Take 0.1 mg by mouth 2 (two) times daily.    [provider]  dicyclomine (BENTYL) 20 MG tablet Take 20 mg by mouth every 6 (six) hours.    [provider]  diphenhydrAMINE (BENADRYL) 25 MG tablet Take 1 tablet (25 mg total) by mouth every 6 (six) hours. 05/03/12   Szekalski, Yvonna Alanis, PA-C  famotidine (PEPCID) 20 MG tablet Take 1 tablet (20 mg total) by mouth 2 (two) times daily. 05/03/12   Szekalski, Kaitlyn, PA-C  fluticasone (CUTIVATE) 0.05 % cream Apply topically 2 (two) times daily. To rash and eyelids. 04/24/12   Linna Hoff, MD  furosemide (LASIX) 40 MG tablet Take 40 mg by mouth daily.    [provider]  hydrALAZINE  (APRESOLINE) 25 MG tablet Take 50 mg by mouth 3 (three) times daily.    [provider]  hydrOXYzine (ATARAX/VISTARIL) 25 MG tablet Take 1 tablet (25 mg total) by mouth every 6 (six) hours. 04/24/12   Linna Hoff, MD  hydrOXYzine (VISTARIL) 25 MG capsule Take 1 capsule (25 mg total) by mouth 3 (three) times daily as needed for itching. 05/11/12   Jethro Bastos, NP  insulin glargine (LANTUS) 100 UNIT/ML injection Inject 40 Units into the skin 2 (two) times daily.    [provider]  insulin lispro (HUMALOG) 100 UNIT/ML injection Inject 4-6 Units into the skin 3 (three) times daily before meals.    [provider]  metFORMIN (GLUCOPHAGE) 500 MG tablet Take 500 mg by mouth 2 (two) times daily with a meal.    [provider]  metoprolol (LOPRESSOR) 50 MG tablet Take 50 mg by mouth 2 (two) times daily.    [provider]  omeprazole (PRILOSEC) 40 MG capsule Take 40 mg by mouth daily.    [provider]  potassium chloride SA (K-DUR,KLOR-CON) 20 MEQ tablet Take 20 mEq by mouth daily.    [provider]    Physical Exam: Vitals:   07/15/17 2000 07/15/17 2015 07/15/17 2030 07/15/17 2045  BP:      Pulse: 93 94 91 89  Resp: 15 16  16 14  Temp:      SpO2: 100% 100% 100% 98%  Weight:      Height:          Constitutional: NAD, calm, comfortable Vitals:   07/15/17 2000 07/15/17 2015 07/15/17 2030 07/15/17 2045  BP:      Pulse: 93 94 91 89  Resp: 15 16 16 14   Temp:      SpO2: 100% 100% 100% 98%  Weight:      Height:       Eyes: PERRL, lids and conjunctivae normal ENMT: Mucous membranes are moist. Posterior pharynx clear of any exudate or lesions.Normal dentition.  Neck: normal, supple, no masses, no thyromegaly Respiratory: good air movement with mild end expiratory wheezing, no crackles. Normal respiratory effort. No accessory muscle use.  Cardiovascular: Regular rate and rhythm, no murmurs / rubs / gallops. No extremity  edema. 2+ pedal pulses. No carotid bruits.  Abdomen: no tenderness, no masses palpated. No hepatosplenomegaly. Bowel sounds positive.  Musculoskeletal: no clubbing / cyanosis. No joint deformity upper and lower extremities. Good ROM, no contractures. Normal muscle tone.  Skin: no rashes, lesions, ulcers. No induration Neurologic: CN 2-12 grossly intact. Sensation intact, DTR normal. Strength 5/5 in all 4.  Psychiatric: Normal judgment and insight. Alert and oriented x 3. Normal mood.    Labs on Admission: I have personally reviewed following labs and imaging studies  CBC: Recent Labs  Lab 07/15/17 1840  WBC 8.8  HGB 13.9  HCT 42.9  MCV 84.6  PLT 252   Basic Metabolic Panel: Recent Labs  Lab 07/15/17 1653  NA 137  K 4.7  CL 103  CO2 25  GLUCOSE 105*  BUN 10  CREATININE 0.92  CALCIUM 8.9   GFR: Estimated Creatinine Clearance: 72.8 mL/min (by C-G formula based on SCr of 0.92 mg/dL). Liver Function Tests: No results for input(s): AST, ALT, ALKPHOS, BILITOT, PROT, ALBUMIN in the last 168 hours. No results for input(s): LIPASE, AMYLASE in the last 168 hours. No results for input(s): AMMONIA in the last 168 hours. Coagulation Profile: No results for input(s): INR, PROTIME in the last 168 hours. Cardiac Enzymes: No results for input(s): CKTOTAL, CKMB, CKMBINDEX, TROPONINI in the last 168 hours. BNP (last 3 results) No results for input(s): PROBNP in the last 8760 hours. HbA1C: No results for input(s): HGBA1C in the last 72 hours. CBG: No results for input(s): GLUCAP in the last 168 hours. Lipid Profile: No results for input(s): CHOL, HDL, LDLCALC, TRIG, CHOLHDL, LDLDIRECT in the last 72 hours. Thyroid Function Tests: No results for input(s): TSH, T4TOTAL, FREET4, T3FREE, THYROIDAB in the last 72 hours. Anemia Panel: No results for input(s): VITAMINB12, FOLATE, FERRITIN, TIBC, IRON, RETICCTPCT in the last 72 hours. Urine analysis: No results found for: COLORURINE,  APPEARANCEUR, LABSPEC, PHURINE, GLUCOSEU, HGBUR, BILIRUBINUR, KETONESUR, PROTEINUR, UROBILINOGEN, NITRITE, LEUKOCYTESUR Sepsis Labs: !!!!!!!!!!!!!!!!!!!!!!!!!!!!!!!!!!!!!!!!!!!! @LABRCNTIP (procalcitonin:4,lacticidven:4) )No results found for this or any previous visit (from the past 240 hour(s)).   Radiological Exams on Admission: Dg Chest 2 View  Result Date: 07/15/2017 CLINICAL DATA:  Cough and wheezing for the past week. History of asthma and diabetes. Former smoker. EXAM: CHEST  2 VIEW COMPARISON:  10/28/2015; 10/02/2012 ; chest CT -12/05/2010 FINDINGS: Examination is degraded due to patient body habitus Grossly unchanged cardiac silhouette and mediastinal contours given slightly reduced lung volumes. Atherosclerotic plaque when the thoracic aorta. The lungs appear hyperexpanded with flattening the diaphragms and mild diffuse slightly nodular thickening of the pulmonary interstitium. Veiling opacities overlying the bilateral  lower lungs are favored to represent overlying breast tissues. No discrete focal airspace opacities. No pleural effusion or pneumothorax. No evidence of edema. No acute osseus abnormalities. Post cholecystectomy. IMPRESSION: Mild lung hyperexpansion and chronic bronchitic change without superimposed acute cardiopulmonary disease. Electronically Signed   By: Simonne ComeJohn  Watts M.D.   On: 07/15/2017 14:09    cxr reviewed no edema or infiltrate Old chart reviewed Case discussed with edp  Assessment/Plan 62 yo female with mild asthma exacerbation  Principal Problem:   Asthma exacerbation- freq nebs.  Zpack.  Iv solumedrol.  Active Problems:   Hypertension- cont home meds   Diabetes mellitus without complication (HCC)- cont home insulin, hold metform, sensitive ssi    Renal disease- stable at baseline  Does not have home neb machine.  Will need refill of inhalers.  DVT prophylaxis:  scds Code Status:  full Family Communication:  dtr Disposition Plan:  Per day team Consults  called:  none Admission status:  observation   Caydance Kuehnle A MD [facility]  If 7PM-7AM, please contact night-coverage [email or web address] Password [email or web address]  07/15/2017, 9:10 PM

## 2017-07-15 NOTE — ED Provider Notes (Signed)
[facility]-EMERGENCY DEPT Provider Note   CSN: [id number] Arrival date & time: 07/15/17  1212     History   Chief Complaint Chief Complaint  Patient presents with  . Cough  . Wheezing    HPI Kristen Dennis is a 62 y.o. female.  HPI   62 year old female with history of asthma, diabetes, renal disease, hypertension presenting for evaluation of cough and wheezing. History obtained through patient and family member who is at bedside. For the past 5 days patient has had sinus congestion, and sneezing, cough productive with yellow sputum, increased wheezing and shortness of breath. Symptom has been persistent and not getting any better. She has been taking Robitussin at home without relief. She tries using her inhaler but it was outdated and did not provide relief. No report of fever, chills, pleuritic chest pain, abdominal pain, back pain, hemoptysis, leg swelling or calf pain. She is blind. Cough sometimes keeps her up at night. Denies prior complication with asthma requiring intubation and ICU stay.       Past Medical History:  Diagnosis Date  . Asthma   . Diabetes mellitus without complication (HCC)   . Encephalitis   . Hypertension   . Renal disease     There are no active problems to display for this patient.   Past Surgical History:  Procedure Laterality Date  . CHOLECYSTECTOMY      OB History    No data available       Home Medications    Prior to Admission medications   Medication Sig Start Date End Date Taking? Authorizing Provider  cloNIDine (CATAPRES) 0.1 MG tablet Take 0.1 mg by mouth 2 (two) times daily.    [provider]  dicyclomine (BENTYL) 20 MG tablet Take 20 mg by mouth every 6 (six) hours.    [provider]  diphenhydrAMINE (BENADRYL) 25 MG tablet Take 1 tablet (25 mg total) by mouth every 6 (six) hours. 05/03/12   Szekalski, Yvonna AlanisKaitlyn, PA-C  famotidine (PEPCID) 20 MG tablet Take 1 tablet (20 mg total) by mouth  2 (two) times daily. 05/03/12   Szekalski, Kaitlyn, PA-C  fluticasone (CUTIVATE) 0.05 % cream Apply topically 2 (two) times daily. To rash and eyelids. 04/24/12   Linna HoffKindl, James D, MD  furosemide (LASIX) 40 MG tablet Take 40 mg by mouth daily.    [provider]  hydrALAZINE (APRESOLINE) 25 MG tablet Take 50 mg by mouth 3 (three) times daily.    [provider]  hydrOXYzine (ATARAX/VISTARIL) 25 MG tablet Take 1 tablet (25 mg total) by mouth every 6 (six) hours. 04/24/12   Linna HoffKindl, James D, MD  hydrOXYzine (VISTARIL) 25 MG capsule Take 1 capsule (25 mg total) by mouth 3 (three) times daily as needed for itching. 05/11/12   Jethro Bastosrawford, Anne W, NP  insulin glargine (LANTUS) 100 UNIT/ML injection Inject 40 Units into the skin 2 (two) times daily.    [provider]  insulin lispro (HUMALOG) 100 UNIT/ML injection Inject 4-6 Units into the skin 3 (three) times daily before meals.    [provider]  metFORMIN (GLUCOPHAGE) 500 MG tablet Take 500 mg by mouth 2 (two) times daily with a meal.    [provider]  metoprolol (LOPRESSOR) 50 MG tablet Take 50 mg by mouth 2 (two) times daily.    [provider]  omeprazole (PRILOSEC) 40 MG capsule Take 40 mg by mouth daily.    [provider]  potassium chloride SA (K-DUR,KLOR-CON) 20  MEQ tablet Take 20 mEq by mouth daily.    [provider]    Family History History reviewed. No pertinent family history.  Social History Social History   Tobacco Use  . Smoking status: Former Games developer  . Smokeless tobacco: Never Used  Substance Use Topics  . Alcohol use: No  . Drug use: No     Allergies   Patient has no known allergies.   Review of Systems Review of Systems  All other systems reviewed and are negative.    Physical Exam Updated Vital Signs BP (!) 162/74 (BP Location: Right Arm)   Pulse 72   Temp 98.6 F (37 C)   Resp 18   Ht 5\' 2"  (1.575 m)   Wt 106.6 kg (235 lb)   LMP  05/03/2012   SpO2 98%   BMI 42.98 kg/m   Physical Exam  Constitutional: She appears well-developed and well-nourished. No distress.  Obese female nontoxic in appearance  HENT:  Head: Atraumatic.  Ears: TMs normal bilaterally nose: Normal nares throat: Uvula is midline no tonsillar enlargement or exudates, no trismus.     Eyes: Conjunctivae are normal.  Neck: Neck supple. No JVD present.  Cardiovascular: Normal rate and regular rhythm.  Pulmonary/Chest: Effort normal. She has wheezes (decreased breath sounds with respiratory expiratory wheezes heard throughout).  Abdominal: Soft. She exhibits no distension. There is no tenderness.  Musculoskeletal: She exhibits no edema.  Neurological: She is alert.  Skin: No rash noted.  Psychiatric: She has a normal mood and affect.  Nursing note and vitals reviewed.    ED Treatments / Results  Labs (all labs ordered are listed, but only abnormal results are displayed) Labs Reviewed  BASIC METABOLIC PANEL - Abnormal; Notable for the following components:      Result Value   Glucose, Bld 105 (*)    All other components within normal limits  CBC - Abnormal; Notable for the following components:   RDW 16.1 (*)    All other components within normal limits    EKG  EKG Interpretation None       Radiology Dg Chest 2 View  Result Date: 07/15/2017 CLINICAL DATA:  Cough and wheezing for the past week. History of asthma and diabetes. Former smoker. EXAM: CHEST  2 VIEW COMPARISON:  10/28/2015; 10/02/2012 ; chest CT -12/05/2010 FINDINGS: Examination is degraded due to patient body habitus Grossly unchanged cardiac silhouette and mediastinal contours given slightly reduced lung volumes. Atherosclerotic plaque when the thoracic aorta. The lungs appear hyperexpanded with flattening the diaphragms and mild diffuse slightly nodular thickening of the pulmonary interstitium. Veiling opacities overlying the bilateral lower lungs are favored to represent  overlying breast tissues. No discrete focal airspace opacities. No pleural effusion or pneumothorax. No evidence of edema. No acute osseus abnormalities. Post cholecystectomy. IMPRESSION: Mild lung hyperexpansion and chronic bronchitic change without superimposed acute cardiopulmonary disease. Electronically Signed   By: Simonne Come M.D.   On: 07/15/2017 14:09    Procedures Procedures (including critical care time)  Medications Ordered in ED Medications  albuterol (PROVENTIL) (2.5 MG/3ML) 0.083% nebulizer solution 5 mg (5 mg Nebulization Given 07/15/17 1424)  albuterol (PROVENTIL) (2.5 MG/3ML) 0.083% nebulizer solution 2.5 mg (2.5 mg Nebulization Given 07/15/17 1701)  ipratropium (ATROVENT) nebulizer solution 0.5 mg (0.5 mg Nebulization Given 07/15/17 1701)  predniSONE (DELTASONE) tablet 60 mg (60 mg Oral Given 07/15/17 1701)  albuterol (PROVENTIL,VENTOLIN) solution continuous neb (10 mg/hr Nebulization Given 07/15/17 1826)     Initial Impression / Assessment and  Plan / ED Course  I have reviewed the triage vital signs and the nursing notes.  Pertinent labs & imaging results that were available during my care of the patient were reviewed by me and considered in my medical decision making (see chart for details).     BP (!) 154/78 (BP Location: Right Arm)   Pulse 82   Temp 98.6 F (37 C)   Resp 20   Ht 5\' 2"  (1.575 m)   Wt 106.6 kg (235 lb)   LMP 05/03/2012   SpO2 100%   BMI 42.98 kg/m    Final Clinical Impressions(s) / ED Diagnoses   Final diagnoses:  Mild persistent asthma with exacerbation  Bronchitis    ED Discharge Orders    None     4:53 PM Patient with history of asthma here with URI symptoms follows with cough and wheezing. She does have inspiratory and expiratory wheezes on exam. Chest x-ray shows evidence of bronchitic changes without evidence of pneumonia. Suspect viral etiology causing asthma exacerbation. Patient will receive duonebs and steroid.  7:29 PM Patient  has received several doses of breathing treatment. Subjectively she feels slightly better, objectively she still having moderate  wheezing. Will have patient ambulate and check O2.  8:47 PM When ambulate with assist, pt became hypoxic with O2 around 88% on RA, she report feeling SOB.  After discussion with pt and family member, plan to consult for admission.    8:59 PM I have consulted [facility] Dr. Onalee Hua who agrees to see and admit pt for asthma exacerbation in the setting of bronchitis.     Fayrene Helper, PA-C 07/15/17 2102    Rolland Porter, MD 07/15/17 [phone]

## 2017-07-15 NOTE — ED Notes (Signed)
Albuterol treatment finished.  Placed patient back on 2L [place].

## 2017-07-15 NOTE — ED Notes (Signed)
Bed: [id number] Expected date:  Expected time:  Means of arrival:  Comments: HELD FOR TR 2

## 2017-07-15 NOTE — ED Notes (Signed)
ED TO INPATIENT HANDOFF REPORT  Name/Age/Gender Kristen Dennis 62 y.o. female  Code Status Code Status History    This patient does not have a recorded code status. Please follow your organizational policy for patients in this situation.      Home/SNF/Other Home  Chief Complaint difficulty breathing and cogestion  Level of Care/Admitting Diagnosis ED Disposition    ED Disposition Condition Comment   Admit  Hospital Area: [facility] [[id number]]  Level of Care: Med-Surg [16]  Diagnosis: Asthma exacerbation, mild [656812]  Admitting Physician: Phillips Grout [[id number]]  Attending Physician: Derrill Kay A [[id number]]  PT Class (Do Not Modify): Observation [104]  PT Acc Code (Do Not Modify): Observation [10022]       Medical History Past Medical History:  Diagnosis Date  . Asthma   . Diabetes mellitus without complication ([place])   . Encephalitis   . Hypertension   . Renal disease     Allergies No Known Allergies  IV Location/Drains/Wounds Patient Lines/Drains/Airways Status   Active Line/Drains/Airways    None          Labs/Imaging Results for orders placed or performed during the hospital encounter of 07/15/17 (from the past 48 hour(s))  Basic metabolic panel     Status: Abnormal   Collection Time: 07/15/17  4:53 PM  Result Value Ref Range   Sodium 137 135 - 145 mmol/L   Potassium 4.7 3.5 - 5.1 mmol/L   Chloride 103 101 - 111 mmol/L   CO2 25 22 - 32 mmol/L   Glucose, Bld 105 (H) 65 - 99 mg/dL   BUN 10 6 - 20 mg/dL   Creatinine, Ser 0.92 0.44 - 1.00 mg/dL   Calcium 8.9 8.9 - 10.3 mg/dL   GFR calc non Af Amer >60 >60 mL/min   GFR calc Af Amer >60 >60 mL/min    Comment: (NOTE) The eGFR has been calculated using the CKD EPI equation. This calculation has not been validated in all clinical situations. eGFR's persistently <60 mL/min signify possible Chronic Kidney Disease.    Anion gap 9 5 - 15  CBC     Status: Abnormal   Collection Time:  07/15/17  6:40 PM  Result Value Ref Range   WBC 8.8 4.0 - 10.5 K/uL   RBC 5.07 3.87 - 5.11 MIL/uL   Hemoglobin 13.9 12.0 - 15.0 g/dL   HCT 42.9 36.0 - 46.0 %   MCV 84.6 78.0 - 100.0 fL   MCH 27.4 26.0 - 34.0 pg   MCHC 32.4 30.0 - 36.0 g/dL   RDW 16.1 (H) 11.5 - 15.5 %   Platelets 252 150 - 400 K/uL   Dg Chest 2 View  Result Date: 07/15/2017 CLINICAL DATA:  Cough and wheezing for the past week. History of asthma and diabetes. Former smoker. EXAM: CHEST  2 VIEW COMPARISON:  10/28/2015; 10/02/2012 ; chest CT -12/05/2010 FINDINGS: Examination is degraded due to patient body habitus Grossly unchanged cardiac silhouette and mediastinal contours given slightly reduced lung volumes. Atherosclerotic plaque when the thoracic aorta. The lungs appear hyperexpanded with flattening the diaphragms and mild diffuse slightly nodular thickening of the pulmonary interstitium. Veiling opacities overlying the bilateral lower lungs are favored to represent overlying breast tissues. No discrete focal airspace opacities. No pleural effusion or pneumothorax. No evidence of edema. No acute osseus abnormalities. Post cholecystectomy. IMPRESSION: Mild lung hyperexpansion and chronic bronchitic change without superimposed acute cardiopulmonary disease. Electronically Signed   By: Sandi Mariscal M.D.   On:  07/15/2017 14:09    Pending Labs Unresulted Labs (From admission, onward)   None      Vitals/Pain Today's Vitals   07/15/17 2000 07/15/17 2015 07/15/17 2030 07/15/17 2045  BP:      Pulse: 93 94 91 89  Resp: 15 16 16 14   Temp:      SpO2: 100% 100% 100% 98%  Weight:      Height:        Isolation Precautions No active isolations  Medications Medications  albuterol (PROVENTIL) (2.5 MG/3ML) 0.083% nebulizer solution 5 mg (5 mg Nebulization Given 07/15/17 1424)  albuterol (PROVENTIL) (2.5 MG/3ML) 0.083% nebulizer solution 2.5 mg (2.5 mg Nebulization Given 07/15/17 1701)  ipratropium (ATROVENT) nebulizer solution 0.5  mg (0.5 mg Nebulization Given 07/15/17 1701)  predniSONE (DELTASONE) tablet 60 mg (60 mg Oral Given 07/15/17 1701)  albuterol (PROVENTIL,VENTOLIN) solution continuous neb (10 mg/hr Nebulization Given 07/15/17 1826)    Mobility Ambulatory with assistance & walker

## 2017-07-15 NOTE — ED Triage Notes (Signed)
PT C/O COUGH AND WHEEZING SINCE LAST WEEK Saturday. PT HAS BEEN USING AN OLD INHALER AND OTC MEDICINES W/O RELIEF. DENIES FEVER.

## 2017-07-15 NOTE — ED Notes (Addendum)
IV team at bedside. Delay in transport to floor.

## 2017-07-16 DIAGNOSIS — E669 Obesity, unspecified: Secondary | ICD-10-CM | POA: Diagnosis present

## 2017-07-16 DIAGNOSIS — Z6841 Body Mass Index (BMI) 40.0 and over, adult: Secondary | ICD-10-CM | POA: Diagnosis not present

## 2017-07-16 DIAGNOSIS — E119 Type 2 diabetes mellitus without complications: Secondary | ICD-10-CM | POA: Diagnosis not present

## 2017-07-16 DIAGNOSIS — R0902 Hypoxemia: Secondary | ICD-10-CM | POA: Diagnosis present

## 2017-07-16 DIAGNOSIS — Z79899 Other long term (current) drug therapy: Secondary | ICD-10-CM | POA: Diagnosis not present

## 2017-07-16 DIAGNOSIS — J4531 Mild persistent asthma with (acute) exacerbation: Secondary | ICD-10-CM | POA: Diagnosis not present

## 2017-07-16 DIAGNOSIS — Z87891 Personal history of nicotine dependence: Secondary | ICD-10-CM | POA: Diagnosis not present

## 2017-07-16 DIAGNOSIS — E1165 Type 2 diabetes mellitus with hyperglycemia: Secondary | ICD-10-CM | POA: Diagnosis present

## 2017-07-16 DIAGNOSIS — J40 Bronchitis, not specified as acute or chronic: Secondary | ICD-10-CM | POA: Diagnosis present

## 2017-07-16 DIAGNOSIS — Z9049 Acquired absence of other specified parts of digestive tract: Secondary | ICD-10-CM | POA: Diagnosis not present

## 2017-07-16 DIAGNOSIS — J45901 Unspecified asthma with (acute) exacerbation: Secondary | ICD-10-CM | POA: Diagnosis not present

## 2017-07-16 DIAGNOSIS — I10 Essential (primary) hypertension: Secondary | ICD-10-CM | POA: Diagnosis not present

## 2017-07-16 DIAGNOSIS — N289 Disorder of kidney and ureter, unspecified: Secondary | ICD-10-CM | POA: Diagnosis present

## 2017-07-16 DIAGNOSIS — T380X5A Adverse effect of glucocorticoids and synthetic analogues, initial encounter: Secondary | ICD-10-CM | POA: Diagnosis present

## 2017-07-16 DIAGNOSIS — Z794 Long term (current) use of insulin: Secondary | ICD-10-CM | POA: Diagnosis not present

## 2017-07-16 LAB — BASIC METABOLIC PANEL
Anion gap: 12 (ref 5–15)
BUN: 11 mg/dL (ref 6–20)
CO2: 22 mmol/L (ref 22–32)
CREATININE: 0.92 mg/dL (ref 0.44–1.00)
Calcium: 8.9 mg/dL (ref 8.9–10.3)
Chloride: 102 mmol/L (ref 101–111)
GFR calc Af Amer: >60 mL/min (ref >60)
GFR calc non Af Amer: >60 mL/min (ref >60)
GLUCOSE: 305 mg/dL — AB (ref 65–99)
Potassium: 4.3 mmol/L (ref 3.5–5.1)
Sodium: 136 mmol/L (ref 135–145)

## 2017-07-16 LAB — CBC
HCT: 41.6 % (ref 36.0–46.0)
Hemoglobin: 13.3 g/dL (ref 12.0–15.0)
MCH: 26.9 pg (ref 26.0–34.0)
MCHC: 32 g/dL (ref 30.0–36.0)
MCV: 84.2 fL (ref 78.0–100.0)
PLATELETS: 328 10*3/uL (ref 150–400)
RBC: 4.94 MIL/uL (ref 3.87–5.11)
RDW: 16.2 % — ABNORMAL HIGH (ref 11.5–15.5)
WBC: 6.5 10*3/uL (ref 4.0–10.5)

## 2017-07-16 LAB — GLUCOSE, CAPILLARY
GLUCOSE-CAPILLARY: 307 mg/dL — AB (ref 65–99)
GLUCOSE-CAPILLARY: 302 mg/dL — AB (ref 65–99)
GLUCOSE-CAPILLARY: 283 mg/dL — AB (ref 65–99)
Glucose-Capillary: 267 mg/dL — ABNORMAL HIGH (ref 65–99)

## 2017-07-16 LAB — BRAIN NATRIURETIC PEPTIDE: B Natriuretic Peptide: 18.2 pg/mL (ref 0.0–100.0)

## 2017-07-16 MED ORDER — ENOXAPARIN SODIUM 40 MG/0.4ML ~~LOC~~ SOLN
40.0000 mg | SUBCUTANEOUS | Status: DC
  Administered 2017-07-16 – 2017-07-17 (×2): 40 mg via SUBCUTANEOUS
  Filled 2017-07-16 (×2): qty 0.4

## 2017-07-16 MED ORDER — ORAL CARE MOUTH RINSE
15.0000 mL | Freq: Two times a day (BID) | OROMUCOSAL | Status: DC
  Administered 2017-07-16 – 2017-07-18 (×4): 15 mL via OROMUCOSAL

## 2017-07-16 NOTE — Progress Notes (Signed)
Swab for respiratory panel sent to lab. Kristen Dennis, [name]

## 2017-07-16 NOTE — Progress Notes (Signed)
PROGRESS NOTE    Kristen Dennis  ZOX:[id number] DOB: [birth year] DOA: 07/15/2017 PCP: Claudean Severance, MD   Brief Narrative: 62 year old female with history of asthma, diabetes, hypertension, obesity presented with worsening shortness of breath, wheezing.  Also with nasal congestion and some sore throat.  Denied fever and sick contact.  Patient was hypoxic to 88% in room air in ER.  Admitted for further evaluation.  Assessment & Plan:  #Acute asthma exacerbation: Patient with cough, nasal congestion and bilateral diffuse wheezing on examination.  She is not at baseline.  Planning to continue IV Solu-Medrol, bronchodilators, Singulair and azithromycin. -Check respiratory virus and observe in droplet precaution. -On 2 L, try to wean to room air. -BNP.  #Hypertension: Continue amlodipine, Lasix, hydralazine, lisinopril, metoprolol.  Monitor blood pressure.  #Type 2 diabetes in obese patient with hyperglycemia: Hypoglycemia likely contributed by steroid.  Continue current insulin regimen.  Monitor blood sugar level.  Low-carb diet.  Continue sliding scale.  Order PT OT therapy Case manager evaluation for safe discharge planning  DVT prophylaxis: Lovenox subcutaneous Code Status: Full code Family Communication: No family at bedside Disposition Plan: Likely discharge home in 1-2 days    Consultants:   None  Procedures: None Antimicrobials: Azithromycin  Subjective: Seen and examined at bedside.  Reported coughing with clear phlegm associated with diffuse wheezing.  Not feeling better and not at her baseline.  Denies chest pain, nausea vomiting.  Has generalized weakness.  Objective: Vitals:   07/15/17 2255 07/16/17 0203 07/16/17 0449 07/16/17 0814  BP: 134/71  (!) 148/59   Pulse: 87  88   Resp:   15   Temp: 97.9 F (36.6 C)  98 F (36.7 C)   TempSrc: Oral  Oral   SpO2: 98% 98% 97% 95%  Weight: 98.6 kg (217 lb 6 oz)     Height: 5\' 2"  (1.575 m)       Intake/Output Summary  (Last 24 hours) at 07/16/2017 1102 Last data filed at 07/16/2017 0917 Gross per 24 hour  Intake 780 ml  Output -  Net 780 ml   Filed Weights   07/15/17 1346 07/15/17 2255  Weight: 106.6 kg (235 lb) 98.6 kg (217 lb 6 oz)    Examination:  General exam: Appears calm and comfortable  Respiratory system: Bilateral diffuse expiratory wheeze with basilar rhonchi.  No increased work of breathing.   Cardiovascular system: S1 & S2 heard, RRR.  No pedal edema. Gastrointestinal system: Abdomen is nondistended, soft and nontender. Normal bowel sounds heard. Central nervous system: Alert and oriented. No focal neurological deficits. Extremities: Symmetric 5 x 5 power. Skin: No rashes, lesions or ulcers Psychiatry: Judgement and insight appear normal. Mood & affect appropriate.     Data Reviewed: I have personally reviewed following labs and imaging studies  CBC: Recent Labs  Lab 07/15/17 1840 07/16/17 0545  WBC 8.8 6.5  HGB 13.9 13.3  HCT 42.9 41.6  MCV 84.6 84.2  PLT 252 328   Basic Metabolic Panel: Recent Labs  Lab 07/15/17 1653 07/16/17 0545  NA 137 136  K 4.7 4.3  CL 103 102  CO2 25 22  GLUCOSE 105* 305*  BUN 10 11  CREATININE 0.92 0.92  CALCIUM 8.9 8.9   GFR: Estimated Creatinine Clearance: 69.6 mL/min (by C-G formula based on SCr of 0.92 mg/dL). Liver Function Tests: No results for input(s): AST, ALT, ALKPHOS, BILITOT, PROT, ALBUMIN in the last 168 hours. No results for input(s): LIPASE, AMYLASE in the last 168 hours. No results  for input(s): AMMONIA in the last 168 hours. Coagulation Profile: No results for input(s): INR, PROTIME in the last 168 hours. Cardiac Enzymes: No results for input(s): CKTOTAL, CKMB, CKMBINDEX, TROPONINI in the last 168 hours. BNP (last 3 results) No results for input(s): PROBNP in the last 8760 hours. HbA1C: No results for input(s): HGBA1C in the last 72 hours. CBG: Recent Labs  Lab 07/15/17 2256 07/16/17 0711  GLUCAP 253* 267*    Lipid Profile: No results for input(s): CHOL, HDL, LDLCALC, TRIG, CHOLHDL, LDLDIRECT in the last 72 hours. Thyroid Function Tests: No results for input(s): TSH, T4TOTAL, FREET4, T3FREE, THYROIDAB in the last 72 hours. Anemia Panel: No results for input(s): VITAMINB12, FOLATE, FERRITIN, TIBC, IRON, RETICCTPCT in the last 72 hours. Sepsis Labs: No results for input(s): PROCALCITON, LATICACIDVEN in the last 168 hours.  No results found for this or any previous visit (from the past 240 hour(s)).       Radiology Studies: Dg Chest 2 View  Result Date: 07/15/2017 CLINICAL DATA:  Cough and wheezing for the past week. History of asthma and diabetes. Former smoker. EXAM: CHEST  2 VIEW COMPARISON:  10/28/2015; 10/02/2012 ; chest CT -12/05/2010 FINDINGS: Examination is degraded due to patient body habitus Grossly unchanged cardiac silhouette and mediastinal contours given slightly reduced lung volumes. Atherosclerotic plaque when the thoracic aorta. The lungs appear hyperexpanded with flattening the diaphragms and mild diffuse slightly nodular thickening of the pulmonary interstitium. Veiling opacities overlying the bilateral lower lungs are favored to represent overlying breast tissues. No discrete focal airspace opacities. No pleural effusion or pneumothorax. No evidence of edema. No acute osseus abnormalities. Post cholecystectomy. IMPRESSION: Mild lung hyperexpansion and chronic bronchitic change without superimposed acute cardiopulmonary disease. Electronically Signed   By: Simonne ComeJohn  Watts M.D.   On: 07/15/2017 14:09        Scheduled Meds: . amLODipine  5 mg Oral Daily  . azithromycin  250 mg Oral QHS  . donepezil  5 mg Oral QHS  . DULoxetine  60 mg Oral QHS  . furosemide  40 mg Oral Daily  . guaiFENesin  600 mg Oral BID  . hydrALAZINE  50 mg Oral TID  . insulin aspart  0-9 Units Subcutaneous TID WC  . insulin glargine  55 Units Subcutaneous QHS  . ipratropium-albuterol  3 mL Nebulization  Q6H  . lisinopril  10 mg Oral QHS  . mouth rinse  15 mL Mouth Rinse BID  . methylPREDNISolone (SOLU-MEDROL) injection  80 mg Intravenous BID  . metoprolol tartrate  50 mg Oral BID  . montelukast  10 mg Oral QHS  . potassium chloride SA  20 mEq Oral Daily  . pregabalin  100 mg Oral TID  . sodium chloride flush  3 mL Intravenous Q12H   Continuous Infusions: . sodium chloride       LOS: 0 days    Jersie Beel Jaynie CollinsPrasad Delynda Sepulveda, MD [facility] Pager [phone]  If 7PM-7AM, please contact night-coverage [email or web address] Password [email or web address] 07/16/2017, 11:02 AM

## 2017-07-17 DIAGNOSIS — J4531 Mild persistent asthma with (acute) exacerbation: Principal | ICD-10-CM

## 2017-07-17 LAB — RESPIRATORY PANEL BY PCR
ADENOVIRUS-RVPPCR: NOT DETECTED
Bordetella pertussis: NOT DETECTED
CORONAVIRUS 229E-RVPPCR: NOT DETECTED
CORONAVIRUS HKU1-RVPPCR: NOT DETECTED
CORONAVIRUS NL63-RVPPCR: NOT DETECTED
Chlamydophila pneumoniae: NOT DETECTED
Coronavirus OC43: NOT DETECTED
Influenza A: NOT DETECTED
Influenza B: NOT DETECTED
METAPNEUMOVIRUS-RVPPCR: NOT DETECTED
Mycoplasma pneumoniae: NOT DETECTED
PARAINFLUENZA VIRUS 2-RVPPCR: NOT DETECTED
PARAINFLUENZA VIRUS 3-RVPPCR: NOT DETECTED
Parainfluenza Virus 1: NOT DETECTED
Parainfluenza Virus 4: NOT DETECTED
Respiratory Syncytial Virus: NOT DETECTED
Rhinovirus / Enterovirus: DETECTED — AB

## 2017-07-17 LAB — GLUCOSE, CAPILLARY
GLUCOSE-CAPILLARY: 248 mg/dL — AB (ref 65–99)
GLUCOSE-CAPILLARY: 251 mg/dL — AB (ref 65–99)
GLUCOSE-CAPILLARY: 306 mg/dL — AB (ref 65–99)
Glucose-Capillary: 317 mg/dL — ABNORMAL HIGH (ref 65–99)

## 2017-07-17 MED ORDER — IPRATROPIUM-ALBUTEROL 0.5-2.5 (3) MG/3ML IN SOLN
3.0000 mL | Freq: Three times a day (TID) | RESPIRATORY_TRACT | Status: DC
  Administered 2017-07-18: 3 mL via RESPIRATORY_TRACT
  Filled 2017-07-17: qty 3

## 2017-07-17 NOTE — Progress Notes (Signed)
PROGRESS NOTE    Kristen Dennis  WUJ:[id number] DOB: 10/25/1955 DOA: 07/15/2017 PCP: Claudean SeveranceBradley, Betty, MD   Brief Narrative: 62 year old female with history of asthma, diabetes, hypertension, obesity presented with worsening shortness of breath, wheezing.  Also with nasal congestion and some sore throat.  Denied fever and sick contact.  Patient was hypoxic to 88% in room air in ER.  Admitted for further evaluation.  Assessment & Plan:  #Acute asthma exacerbation due to rhinovirus: Patient is still with cough, nasal congestion, bilateral diffuse wheeze and coughing.  Not at baseline.  Planning to continue Cytomel, bronchodilators Singulair and azithromycin. -Rhinovirus positive -Currently on 2 L of oxygen.  BNP not elevated.  #Hypertension: Continue amlodipine, Lasix, hydralazine, lisinopril, metoprolol.  Monitor blood pressure.  #Type 2 diabetes in obese patient with hyperglycemia: Hyperglycemia likely contributed by steroid.  Continue current insulin regimen.  Monitor blood sugar level.  Low-carb diet.  Continue sliding scale.  Order PT OT therapy Case manager evaluation for safe discharge planning  DVT prophylaxis: Lovenox subcutaneous Code Status: Full code Family Communication: No family at bedside Disposition Plan: Likely discharge home in 1-2 days    Consultants:   None  Procedures: None Antimicrobials: Azithromycin  Subjective: Seen and examined at bedside.  Still cough is worse and associated with mild shortness of breath.  Denied nausea vomiting.  Not at baseline.  Objective: Vitals:   07/17/17 0631 07/17/17 0939 07/17/17 1124 07/17/17 1410  BP: (!) 144/80   (!) 114/50  Pulse: 78 79  71  Resp: 18 17  18   Temp: 98 F (36.7 C)   98 F (36.7 C)  TempSrc: Oral   Oral  SpO2: 98% 96% (!) 75% 98%  Weight:      Height:        Intake/Output Summary (Last 24 hours) at 07/17/2017 1431 Last data filed at 07/17/2017 1410 Gross per 24 hour  Intake 1200 ml  Output -  Net  1200 ml   Filed Weights   07/15/17 1346 07/15/17 2255  Weight: 106.6 kg (235 lb) 98.6 kg (217 lb 6 oz)    Examination:  General exam: Sitting on chair comfortable, not in distress. Respiratory system: Bilateral diffuse wheeze with basilar rhonchi.  Respiratory effort normal. Cardiovascular system: Regular rate rhythm S1-S2 normal.  No pedal edema. Gastrointestinal system: Abdomen is nondistended, soft and nontender. Normal bowel sounds heard. Central nervous system: Alert and oriented. No focal neurological deficits. Extremities: Symmetric 5 x 5 power. Skin: No rashes, lesions or ulcers Psychiatry: Judgement and insight appear normal. Mood & affect appropriate.     Data Reviewed: I have personally reviewed following labs and imaging studies  CBC: Recent Labs  Lab 07/15/17 1840 07/16/17 0545  WBC 8.8 6.5  HGB 13.9 13.3  HCT 42.9 41.6  MCV 84.6 84.2  PLT 252 328   Basic Metabolic Panel: Recent Labs  Lab 07/15/17 1653 07/16/17 0545  NA 137 136  K 4.7 4.3  CL 103 102  CO2 25 22  GLUCOSE 105* 305*  BUN 10 11  CREATININE 0.92 0.92  CALCIUM 8.9 8.9   GFR: Estimated Creatinine Clearance: 69.6 mL/min (by C-G formula based on SCr of 0.92 mg/dL). Liver Function Tests: No results for input(s): AST, ALT, ALKPHOS, BILITOT, PROT, ALBUMIN in the last 168 hours. No results for input(s): LIPASE, AMYLASE in the last 168 hours. No results for input(s): AMMONIA in the last 168 hours. Coagulation Profile: No results for input(s): INR, PROTIME in the last 168 hours. Cardiac Enzymes:  No results for input(s): CKTOTAL, CKMB, CKMBINDEX, TROPONINI in the last 168 hours. BNP (last 3 results) No results for input(s): PROBNP in the last 8760 hours. HbA1C: No results for input(s): HGBA1C in the last 72 hours. CBG: Recent Labs  Lab 07/16/17 1127 07/16/17 1705 07/16/17 2231 07/17/17 0722 07/17/17 1133  GLUCAP 307* 302* 283* 248* 251*   Lipid Profile: No results for input(s):  CHOL, HDL, LDLCALC, TRIG, CHOLHDL, LDLDIRECT in the last 72 hours. Thyroid Function Tests: No results for input(s): TSH, T4TOTAL, FREET4, T3FREE, THYROIDAB in the last 72 hours. Anemia Panel: No results for input(s): VITAMINB12, FOLATE, FERRITIN, TIBC, IRON, RETICCTPCT in the last 72 hours. Sepsis Labs: No results for input(s): PROCALCITON, LATICACIDVEN in the last 168 hours.  Recent Results (from the past 240 hour(s))  Respiratory Panel by PCR     Status: Abnormal   Collection Time: 07/16/17  5:00 PM  Result Value Ref Range Status   Adenovirus NOT DETECTED NOT DETECTED Final   Coronavirus 229E NOT DETECTED NOT DETECTED Final   Coronavirus HKU1 NOT DETECTED NOT DETECTED Final   Coronavirus NL63 NOT DETECTED NOT DETECTED Final   Coronavirus OC43 NOT DETECTED NOT DETECTED Final   Metapneumovirus NOT DETECTED NOT DETECTED Final   Rhinovirus / Enterovirus DETECTED (A) NOT DETECTED Final   Influenza A NOT DETECTED NOT DETECTED Final   Influenza B NOT DETECTED NOT DETECTED Final   Parainfluenza Virus 1 NOT DETECTED NOT DETECTED Final   Parainfluenza Virus 2 NOT DETECTED NOT DETECTED Final   Parainfluenza Virus 3 NOT DETECTED NOT DETECTED Final   Parainfluenza Virus 4 NOT DETECTED NOT DETECTED Final   Respiratory Syncytial Virus NOT DETECTED NOT DETECTED Final   Bordetella pertussis NOT DETECTED NOT DETECTED Final   Chlamydophila pneumoniae NOT DETECTED NOT DETECTED Final   Mycoplasma pneumoniae NOT DETECTED NOT DETECTED Final    Comment: Performed at [facility], [address], [place], [place] [address]         Radiology Studies: No results found.      Scheduled Meds: . amLODipine  5 mg Oral Daily  . azithromycin  250 mg Oral QHS  . donepezil  5 mg Oral QHS  . DULoxetine  60 mg Oral QHS  . enoxaparin (LOVENOX) injection  40 mg Subcutaneous Q24H  . furosemide  40 mg Oral Daily  . guaiFENesin  600 mg Oral BID  . hydrALAZINE  50 mg Oral TID  . insulin aspart  0-9  Units Subcutaneous TID WC  . insulin glargine  55 Units Subcutaneous QHS  . ipratropium-albuterol  3 mL Nebulization Q6H  . lisinopril  10 mg Oral QHS  . mouth rinse  15 mL Mouth Rinse BID  . methylPREDNISolone (SOLU-MEDROL) injection  80 mg Intravenous BID  . metoprolol tartrate  50 mg Oral BID  . montelukast  10 mg Oral QHS  . potassium chloride SA  20 mEq Oral Daily  . pregabalin  100 mg Oral TID  . sodium chloride flush  3 mL Intravenous Q12H   Continuous Infusions: . sodium chloride       LOS: 1 day    Myalynn Lingle Jaynie Collins, MD [facility] Pager [phone]  If 7PM-7AM, please contact night-coverage [email or web address] Password [facility] 07/17/2017, 2:31 PM

## 2017-07-17 NOTE — Progress Notes (Signed)
Nurse called infection control. Per infection control because pt is only positive for rhinovirus, pt can be taken off of droplet precautions. Standard precautions are needed for an adult pt positive for rhinovirus.

## 2017-07-17 NOTE — Evaluation (Signed)
Occupational Therapy Evaluation Patient Details Name: Kristen Dennis MRN: [id number] DOB: 04/30/1956 Today's Date: 07/17/2017    History of Present Illness Kristen Dennis is a 62 y.o. female with medical history significant of asthma, dm, htn comes in with a week of worsening sob and wheezing not getting better at home with her expired albuterol inhaler.  She has been having nasal congestion and recent cold.     Clinical Impression   Pt admitted with worsening SOB. Pt currently with functional limitations due to the deficits listed below (see OT Problem List). Pt will benefit from skilled OT to increase their safety and independence with ADL and functional mobility for ADL to facilitate discharge to venue listed below.     Follow Up Recommendations  No OT follow up    Equipment Recommendations  Other (comment)       Precautions / Restrictions Precautions Precautions: Fall Precaution Comments: pt desats quickly      Mobility Bed Mobility Overal bed mobility: Needs Assistance Bed Mobility: Supine to Sit     Supine to sit: Min assist        Transfers Overall transfer level: Needs assistance Equipment used: Rolling walker (2 wheeled) Transfers: Sit to/from [employer] Sit to Stand: Min assist Stand pivot transfers: Min assist       General transfer comment: VC for hand placement    Balance Overall balance assessment: History of Falls;Needs assistance   Sitting balance-Leahy Scale: Good     Standing balance support: Bilateral upper extremity supported Standing balance-Leahy Scale: Fair                             ADL either performed or assessed with clinical judgement   ADL Overall ADL's : Needs assistance/impaired Eating/Feeding: Minimal assistance;Sitting Eating/Feeding Details (indicate cue type and reason): due to vision Grooming: Minimal assistance;Sitting   Upper Body Bathing: Minimal assistance;Sitting   Lower Body Bathing:  Moderate assistance;Sit to/from stand;Cueing for sequencing;Cueing for safety   Upper Body Dressing : Minimal assistance;Sitting   Lower Body Dressing: Moderate assistance;Sit to/from stand   Toilet Transfer: RW;Minimal assistance;Ambulation;Cueing for sequencing;Cueing for safety   Toileting- Clothing Manipulation and Hygiene: Minimal assistance;Sit to/from stand;Cueing for sequencing;Cueing for safety         General ADL Comments: vision limiting pt.  Pt fatigues quickly.  Pt desats quickly but recovers     Vision Baseline Vision/History: Legally blind Patient Visual Report: No change from baseline              Pertinent Vitals/Pain Pain Assessment: No/denies pain     Hand Dominance     Extremity/Trunk Assessment Upper Extremity Assessment Upper Extremity Assessment: LUE deficits/detail;Generalized weakness           Communication Communication Communication: No difficulties   Cognition Arousal/Alertness: Awake/alert Behavior During Therapy: WFL for tasks assessed/performed Overall Cognitive Status: Within Functional Limits for tasks assessed                                     General Comments   sister provides A as needed            Home Living Family/patient expects to be discharged to:: Private residence Living Arrangements: Other relatives Available Help at Discharge: Family Type of Home: House             Bathroom Shower/Tub: Tub/shower unit(pt states  she wants a walk in shower. sister helps her in tub 1 day a week)   Bathroom Toilet: Standard     Home Equipment: Environmental consultantWalker - 2 wheels          Prior Functioning/Environment Level of Independence: Needs assistance  Gait / Transfers Assistance Needed: pt is legally blind.  sister A with bathing , dressing and all ADL activity               OT Problem List: Decreased strength;Decreased activity tolerance;Impaired balance (sitting and/or standing);Obesity;Decreased  knowledge of use of DME or AE;Impaired vision/perception      OT Treatment/Interventions: Self-care/ADL training;Patient/family education;DME and/or AE instruction    OT Goals(Current goals can be found in the care plan section) Acute Rehab OT Goals Patient Stated Goal: home with sister OT Goal Formulation: With patient Time For Goal Achievement: 07/31/17  OT Frequency: Min 2X/week   Barriers to D/C:               AM-PAC PT "6 Clicks" Daily Activity     Outcome Measure Help from another person eating meals?: A Little Help from another person taking care of personal grooming?: A Little Help from another person toileting, which includes using toliet, bedpan, or urinal?: A Little Help from another person bathing (including washing, rinsing, drying)?: A Lot Help from another person to put on and taking off regular upper body clothing?: A Little Help from another person to put on and taking off regular lower body clothing?: A Lot 6 Click Score: 16   End of Session Equipment Utilized During Treatment: Rolling walker Nurse Communication: Mobility status  Activity Tolerance: Patient tolerated treatment well Patient left: in chair;with call bell/phone within reach;with nursing/sitter in room  OT Visit Diagnosis: Unsteadiness on feet (R26.81);Muscle weakness (generalized) (M62.81);Low vision, both eyes (H54.2);History of falling (Z91.81)                Time: 1000-1026 OT Time Calculation (min): 26 min Charges:  OT General Charges $OT Visit: 1 Visit OT Evaluation $OT Eval Moderate Complexity: 1 Mod OT Treatments $Self Care/Home Management : 8-22 mins G-Codes:     Lise AuerLori Leldon Steege, OT [phone]  Einar CrowEDDING, Mardy Lucier D 07/17/2017, 11:36 AM

## 2017-07-17 NOTE — Progress Notes (Signed)
Spoke with patient at bedside. States she lives at home with her sister and brother-in-law. They help her as needed, they provide transportation, meals, assist with medications. Patient states she is independent with ADL's except sometimes her sister helps. She ambulates with a walker. She has a PCP that she has seen recently and understands to f/u with them after a hospital stay. [facility]. Will continue to follow for d/c needs. [phone]

## 2017-07-17 NOTE — Evaluation (Addendum)
Physical Therapy Evaluation Patient Details Name: Kristen Dennis MRN: [id number] DOB: May 17, 1956 Today's Date: 07/17/2017   History of Present Illness  62 y.o. female admitted with asthma exacerbation. Hx of  DM, asthma, HTN, legal blindness.   Clinical Impression  On eval, pt was Min guard-Min assist for mobility. She walked ~75 feet with a RW on today. O2 sat 88% on 2L [place] O2 during ambulation. Attempted to use pt's walker from home (modified with tennis balls in front/no wheels). She requested to use rolling walker instead. Will continue to follow and progress activity as tolerated.     Follow Up Recommendations Supervision/Assistance - 24 hour    Equipment Recommendations  Rolling walker with 5" wheels(may possibly need. )    Recommendations for Other Services       Precautions / Restrictions Precautions Precautions: Fall Precaution Comments: monitor O2 sats Restrictions Weight Bearing Restrictions: No      Mobility  Bed Mobility Overal bed mobility: Needs Assistance           General bed mobility comments: oob in recliner  Transfers Overall transfer level: Needs assistance Equipment used: Rolling walker (2 wheeled) Transfers: Sit to/from Stand Sit to Stand: Min guard        General transfer comment: close guard for safety. VCs hand placement.   Ambulation/Gait Ambulation/Gait assistance: Min assist Ambulation Distance (Feet): 75 Feet Assistive device: Rolling walker (2 wheeled) Gait Pattern/deviations: Step-through pattern     General Gait Details: Intermittent assist-mostly due to environment. Pt tolerated distanace well. O2 sat 88% on 2L O2 during ambulation.   Stairs            Wheelchair Mobility    Modified Rankin (Stroke Patients Only)       Balance Overall balance assessment: History of Falls;Needs assistance   Sitting balance-Leahy Scale: Good     Standing balance support: Bilateral upper extremity supported Standing balance-Leahy  Scale: Fair                               Pertinent Vitals/Pain Pain Assessment: No/denies pain    Home Living Family/patient expects to be discharged to:: Private residence Living Arrangements: Other relatives(sister) Available Help at Discharge: Family Type of Home: House         Home Equipment: Other (comment)(pt has walker with tennis balls on front and rubber stops on back???)      Prior Function Level of Independence: Needs assistance   Gait / Transfers Assistance Needed: pt is legally blind. She stated she is able to walk around her home without assistance/with walker  ADL's / Homemaking Assistance Needed: sister assists with ADLs.         Hand Dominance        Extremity/Trunk Assessment   Upper Extremity Assessment Upper Extremity Assessment: Defer to OT evaluation    Lower Extremity Assessment Lower Extremity Assessment: Generalized weakness    Cervical / Trunk Assessment Cervical / Trunk Assessment: Normal  Communication   Communication: No difficulties  Cognition Arousal/Alertness: Awake/alert Behavior During Therapy: WFL for tasks assessed/performed Overall Cognitive Status: Within Functional Limits for tasks assessed                                        General Comments      Exercises     Assessment/Plan    PT Assessment Patient  needs continued PT services  PT Problem List Decreased strength;Decreased balance;Decreased activity tolerance;Decreased mobility       PT Treatment Interventions DME instruction;Functional mobility training;Therapeutic activities;Therapeutic exercise;Balance training;Patient/family education;Gait training    PT Goals (Current goals can be found in the Care Plan section)  Acute Rehab PT Goals Patient Stated Goal: home with sister PT Goal Formulation: With patient Time For Goal Achievement: 07/31/17 Potential to Achieve Goals: Good    Frequency Min 3X/week   Barriers to  discharge        Co-evaluation               AM-PAC PT "6 Clicks" Daily Activity  Outcome Measure Difficulty turning over in bed (including adjusting bedclothes, sheets and blankets)?: A Little Difficulty moving from lying on back to sitting on the side of the bed? : A Little Difficulty sitting down on and standing up from a chair with arms (e.g., wheelchair, bedside commode, etc,.)?: A Little Help needed moving to and from a bed to chair (including a wheelchair)?: A Little Help needed walking in hospital room?: A Little Help needed climbing 3-5 steps with a railing? : A Little 6 Click Score: 18    End of Session Equipment Utilized During Treatment: Gait belt Activity Tolerance: Patient tolerated treatment well Patient left: in chair;with call bell/phone within reach   PT Visit Diagnosis: Muscle weakness (generalized) (M62.81);Difficulty in walking, not elsewhere classified (R26.2)    Time: [id number] PT Time Calculation (min) (ACUTE ONLY): 16 min   Charges:   PT Evaluation $PT Eval Moderate Complexity: 1 Mod     PT G Codes:        Kristen Dennis, MPT Pager: [phone]

## 2017-07-17 NOTE — Progress Notes (Signed)
Inpatient Diabetes Program Recommendations  AACE/ADA: New Consensus Statement on Inpatient Glycemic Control (2015)  Target Ranges:  Prepandial:   less than 140 mg/dL      Peak postprandial:   less than 180 mg/dL (1-2 hours)      Critically ill patients:  140 - 180 mg/dL   Results for Trula SladeHILL, Basil (MRN [id number]) as of 07/17/2017 10:24  Ref. Range 07/16/2017 07:11 07/16/2017 11:27 07/16/2017 17:05 07/16/2017 22:31 07/17/2017 07:22  Glucose-Capillary Latest Ref Range: 65 - 99 mg/dL 119267 (H) 147307 (H) 829302 (H) 283 (H) 248 (H)   Review of Glycemic Control  Diabetes history: DM2 Outpatient Diabetes medications: Lantus 55 units QHS, Metformin 1000 mg BID Current orders for Inpatient glycemic control: Lantus 55 units QHS, Novolog 0-9 units TID with meals; Solumedrol 80 mg BID  Inpatient Diabetes Program Recommendations:  Insulin - Basal: If steroids are continued, please consider increasing Lantus to 60 units QHS. Correction (SSI): Please consider increasing Novolog correction to Moderate scale (0-15 units) and adding Novolog 0-5 units QHS for bedtime correction. Insulin - Meal Coverage: If steroids are continued, please consider ordering Novolog 5 units TID with meals for meal coverage if patient eats at least 50% of meals.  Thanks, Orlando PennerMarie Shaylynn Nulty, RN, MSN, CDE Diabetes Coordinator Inpatient Diabetes Program [phone] (Team Pager from 8am to 5pm)

## 2017-07-18 DIAGNOSIS — E119 Type 2 diabetes mellitus without complications: Secondary | ICD-10-CM

## 2017-07-18 DIAGNOSIS — I10 Essential (primary) hypertension: Secondary | ICD-10-CM

## 2017-07-18 DIAGNOSIS — J45901 Unspecified asthma with (acute) exacerbation: Secondary | ICD-10-CM

## 2017-07-18 LAB — GLUCOSE, CAPILLARY
Glucose-Capillary: 284 mg/dL — ABNORMAL HIGH (ref 65–99)
Glucose-Capillary: 286 mg/dL — ABNORMAL HIGH (ref 65–99)

## 2017-07-18 MED ORDER — AZITHROMYCIN 250 MG PO TABS: 250.0000 mg | ORAL_TABLET | Freq: Every day | ORAL | 0 refills | Status: AC

## 2017-07-18 MED ORDER — ALBUTEROL SULFATE HFA 108 (90 BASE) MCG/ACT IN AERS: 2.0000 | INHALATION_SPRAY | Freq: Four times a day (QID) | RESPIRATORY_TRACT | 1 refills | Status: AC | PRN

## 2017-07-18 MED ORDER — GUAIFENESIN ER 600 MG PO TB12: 600.0000 mg | ORAL_TABLET | Freq: Two times a day (BID) | ORAL | 0 refills | Status: AC

## 2017-07-18 MED ORDER — PREDNISONE 50 MG PO TABS: 50.0000 mg | ORAL_TABLET | Freq: Every day | ORAL | 0 refills | Status: AC

## 2017-07-18 NOTE — Progress Notes (Signed)
Occupational Therapy Treatment Patient Details Name: Alyvia Derk MRN: [id number] DOB: 1956/02/27 Today's Date: 07/18/2017    History of present illness 62 y.o. female admitted with asthma exacerbation. Hx of  DM, asthma, HTN, legal blindness.       Follow Up Recommendations  Home health OT;Supervision/Assistance - 24 hour    Equipment Recommendations  Other (comment)       Precautions / Restrictions Precautions Precautions: Fall Precaution Comments: monitor O2 sats Restrictions Weight Bearing Restrictions: No       Mobility Bed Mobility Overal bed mobility: Needs Assistance Bed Mobility: Supine to Sit     Supine to sit: Min assist        Transfers Overall transfer level: Needs assistance Equipment used: Rolling walker (2 wheeled) Transfers: Sit to/from [employer] Sit to Stand: Min assist Stand pivot transfers: Min assist       General transfer comment: VC for hand placement    Balance Overall balance assessment: History of Falls;Needs assistance   Sitting balance-Leahy Scale: Good     Standing balance support: Bilateral upper extremity supported Standing balance-Leahy Scale: Fair                             ADL either performed or assessed with clinical judgement   ADL                   Upper Body Dressing : Set up;Sitting   Lower Body Dressing: Sit to/from stand;Cueing for safety;Cueing for sequencing   Toilet Transfer: RW;Minimal assistance;Ambulation;Cueing for sequencing;Cueing for safety   Toileting- Clothing Manipulation and Hygiene: Minimal assistance;Sit to/from stand;Cueing for sequencing;Cueing for safety         General ADL Comments: pt agreed to get OOB.  Pt walked to BR and sats stayed over 87.  Oxygen reapplied when pt got to chair and sats quickly returned to greater than 90.  Pt coughing increased with mobilty .  RW aware.  Pt peeing with coughing. Mesh panties provided as well as a pad              Cognition Arousal/Alertness: Awake/alert Behavior During Therapy: WFL for tasks assessed/performed Overall Cognitive Status: Within Functional Limits for tasks assessed                                                     Pertinent Vitals/ Pain       Pain Assessment: No/denies pain     Prior Functioning/Environment              Frequency  Min 2X/week        Progress Toward Goals  OT Goals(current goals can now be found in the care plan section)  Progress towards OT goals: Progressing toward goals     Plan Discharge plan remains appropriate       AM-PAC PT "6 Clicks" Daily Activity     Outcome Measure   Help from another person eating meals?: A Little Help from another person taking care of personal grooming?: A Little Help from another person toileting, which includes using toliet, bedpan, or urinal?: A Little Help from another person bathing (including washing, rinsing, drying)?: A Lot Help from another person to put on and taking off regular upper body clothing?: A Little Help from another person  to put on and taking off regular lower body clothing?: A Lot 6 Click Score: 16    End of Session Equipment Utilized During Treatment: Rolling walker  OT Visit Diagnosis: Unsteadiness on feet (R26.81);Muscle weakness (generalized) (M62.81);Low vision, both eyes (H54.2);History of falling (Z91.81)   Activity Tolerance Patient tolerated treatment well   Patient Left in chair;with call bell/phone within reach;with nursing/sitter in room   Nurse Communication Mobility status        Time: [id number] OT Time Calculation (min): 10 min  Charges: OT General Charges $OT Visit: 1 Visit OT Treatments $Self Care/Home Management : 8-22 mins  Valley-HiLori Damoni Causby, [place] [phone]   Einar CrowEDDING, Dex Blakely D 07/18/2017, 10:48 AM

## 2017-07-18 NOTE — Discharge Summary (Signed)
Physician Discharge Summary  Kristen Dennis ZOX:[id number] DOB: Aug 11, 1955 DOA: 07/15/2017  PCP: Claudean Severance, MD  Admit date: 07/15/2017 Discharge date: 07/18/2017  Admitted From:home Disposition:home  Recommendations for Outpatient Follow-up:  1. Follow up with PCP in 1-2 weeks 2. Please obtain BMP/CBC in one week   Home Health:yes Equipment/Devices:none Discharge Condition:stable CODE STATUS:full code Diet recommendation:carb modified, healthy diet.  Brief/Interim Summary: 62 year old female with history of asthma, diabetes, hypertension, obesity presented with worsening shortness of breath, wheezing.  Also with nasal congestion and some sore throat.  Denied fever and sick contact.  Patient was hypoxic to 88% in room air in ER.  Admitted for further evaluation.  #Acute asthma exacerbation due to rhinovirus:  Patient with history of mild intermittent asthma.  Chest x-ray with chronic changes.  BNP not elevated.  Treated with a steroid, bronchodilators with clinical improvement.  Currently on room air with  acceptable oxygen saturation.  No chest pain or shortness of breath.  Mild cough is present.  Overall patient is improved.  Planning to discharge with the bronchodilators, oral prednisone short course.  Home care services ordered.  Recommend to follow-up with PCP.  #Hypertension: Continue amlodipine, Lasix, hydralazine, lisinopril, metoprolol.  Monitor blood pressure.  #Type 2 diabetes in obese patient with hyperglycemia: Monitor blood sugar level.  Resume home medication.  Recommend to follow-up with PCP.  Patient is stable on discharge with outpatient follow-up.  On room air.  Discharge Diagnoses:  Principal Problem:   Asthma exacerbation Active Problems:   Hypertension   Diabetes mellitus without complication (HCC)   Asthma   Renal disease   Asthma exacerbation, mild    Discharge Instructions  Discharge Instructions    Call MD for:  difficulty breathing, headache or  visual disturbances   Complete by:  As directed    Call MD for:  extreme fatigue   Complete by:  As directed    Call MD for:  hives   Complete by:  As directed    Call MD for:  persistant dizziness or light-headedness   Complete by:  As directed    Call MD for:  persistant nausea and vomiting   Complete by:  As directed    Call MD for:  severe uncontrolled pain   Complete by:  As directed    Call MD for:  temperature >100.4   Complete by:  As directed    Diet - low sodium heart healthy   Complete by:  As directed    Diet - low sodium heart healthy   Complete by:  As directed    Diet Carb Modified   Complete by:  As directed    Increase activity slowly   Complete by:  As directed    Increase activity slowly   Complete by:  As directed      Allergies as of 07/18/2017   No Known Allergies     Medication List    TAKE these medications   albuterol 108 (90 Base) MCG/ACT inhaler Commonly known as:  PROVENTIL HFA;VENTOLIN HFA Inhale 2 puffs into the lungs every 6 (six) hours as needed for wheezing or shortness of breath.   amLODipine 5 MG tablet Commonly known as:  NORVASC Take 5 mg by mouth daily.   azithromycin 250 MG tablet Commonly known as:  ZITHROMAX Take 1 tablet (250 mg total) by mouth daily for 3 days.   donepezil 5 MG tablet Commonly known as:  ARICEPT Take 5 mg by mouth at bedtime.   DULoxetine 60  MG capsule Commonly known as:  CYMBALTA Take 60 mg by mouth at bedtime.   furosemide 40 MG tablet Commonly known as:  LASIX Take 40 mg by mouth daily.   guaiFENesin 600 MG 12 hr tablet Commonly known as:  MUCINEX Take 1 tablet (600 mg total) by mouth 2 (two) times daily for 10 days.   hydrALAZINE 50 MG tablet Commonly known as:  APRESOLINE Take 50 mg by mouth 3 (three) times daily.   insulin glargine 100 UNIT/ML injection Commonly known as:  LANTUS Inject 55 Units into the skin at bedtime.   lisinopril 10 MG tablet Commonly known as:   PRINIVIL,ZESTRIL Take 10 mg by mouth at bedtime.   LYRICA 100 MG capsule Generic drug:  pregabalin Take 100 mg by mouth 3 (three) times daily.   metFORMIN 500 MG tablet Commonly known as:  GLUCOPHAGE Take 1,000 mg by mouth 2 (two) times daily.   metoprolol tartrate 50 MG tablet Commonly known as:  LOPRESSOR Take 50 mg by mouth 2 (two) times daily.   montelukast 10 MG tablet Commonly known as:  SINGULAIR Take 10 mg by mouth at bedtime.   omeprazole 20 MG capsule Commonly known as:  PRILOSEC Take 20 mg by mouth every evening.   potassium chloride SA 20 MEQ tablet Commonly known as:  K-DUR,KLOR-CON Take 20 mEq by mouth daily.   predniSONE 50 MG tablet Commonly known as:  DELTASONE Take 1 tablet (50 mg total) by mouth daily with breakfast for 3 days.            Durable Medical Equipment  (From admission, onward)        Start     Ordered   07/18/17 1216  For home use only DME Walker rolling  [facility](Walkers)  Once    Question:  Patient needs a walker to treat with the following condition  Answer:  Weakness   07/18/17 1215     Follow-up Information    Claudean SeveranceBradley, Betty, MD. Schedule an appointment as soon as possible for a visit in 1 week(s).   Specialty:  Family Medicine Contact information: [address] [place] [phone]          No Known Allergies  Consultations: None  Procedures/Studies: None  Subjective: Seen and examined at bedside.  Reported feeling better.  Denied nausea vomiting chest pain or shortness of breath.  Having mild cough which is gradually improving.  Discharge Exam: Vitals:   07/18/17 0940 07/18/17 1211  BP: 120/70   Pulse: 75   Resp:    Temp:    SpO2:  94%   Vitals:   07/18/17 0449 07/18/17 0825 07/18/17 0940 07/18/17 1211  BP: 133/77  120/70   Pulse: 67  75   Resp: 18     Temp: 98.7 F (37.1 C)     TempSrc: Oral     SpO2: 99% 98%  94%  Weight:      Height:        General: Pt is alert, awake, not  in acute distress Cardiovascular: RRR, S1/S2 +, no rubs, no gallops Respiratory: Bilateral basal wheeze but improving.  Good air entry.  Respiratory effort normal. Abdominal: Soft, NT, ND, bowel sounds + Extremities: no edema, no cyanosis    The results of significant diagnostics from this hospitalization (including imaging, microbiology, ancillary and laboratory) are listed below for reference.     Microbiology: Recent Results (from the past 240 hour(s))  Respiratory Panel by PCR  Status: Abnormal   Collection Time: 07/16/17  5:00 PM  Result Value Ref Range Status   Adenovirus NOT DETECTED NOT DETECTED Final   Coronavirus 229E NOT DETECTED NOT DETECTED Final   Coronavirus HKU1 NOT DETECTED NOT DETECTED Final   Coronavirus NL63 NOT DETECTED NOT DETECTED Final   Coronavirus OC43 NOT DETECTED NOT DETECTED Final   Metapneumovirus NOT DETECTED NOT DETECTED Final   Rhinovirus / Enterovirus DETECTED (A) NOT DETECTED Final   Influenza A NOT DETECTED NOT DETECTED Final   Influenza B NOT DETECTED NOT DETECTED Final   Parainfluenza Virus 1 NOT DETECTED NOT DETECTED Final   Parainfluenza Virus 2 NOT DETECTED NOT DETECTED Final   Parainfluenza Virus 3 NOT DETECTED NOT DETECTED Final   Parainfluenza Virus 4 NOT DETECTED NOT DETECTED Final   Respiratory Syncytial Virus NOT DETECTED NOT DETECTED Final   Bordetella pertussis NOT DETECTED NOT DETECTED Final   Chlamydophila pneumoniae NOT DETECTED NOT DETECTED Final   Mycoplasma pneumoniae NOT DETECTED NOT DETECTED Final    Comment: Performed at [facility], [address] [place], [place] [address]     Labs: BNP (last 3 results) Recent Labs    07/16/17 0545  BNP 18.2   Basic Metabolic Panel: Recent Labs  Lab 07/15/17 1653 07/16/17 0545  NA 137 136  K 4.7 4.3  CL 103 102  CO2 25 22  GLUCOSE 105* 305*  BUN 10 11  CREATININE 0.92 0.92  CALCIUM 8.9 8.9   Liver Function Tests: No results for input(s): AST, ALT, ALKPHOS,  BILITOT, PROT, ALBUMIN in the last 168 hours. No results for input(s): LIPASE, AMYLASE in the last 168 hours. No results for input(s): AMMONIA in the last 168 hours. CBC: Recent Labs  Lab 07/15/17 1840 07/16/17 0545  WBC 8.8 6.5  HGB 13.9 13.3  HCT 42.9 41.6  MCV 84.6 84.2  PLT 252 328   Cardiac Enzymes: No results for input(s): CKTOTAL, CKMB, CKMBINDEX, TROPONINI in the last 168 hours. BNP: Invalid input(s): POCBNP CBG: Recent Labs  Lab 07/17/17 1133 07/17/17 1645 07/17/17 2242 07/18/17 0719 07/18/17 1157  GLUCAP 251* 306* 317* 284* 286*   D-Dimer No results for input(s): DDIMER in the last 72 hours. Hgb A1c No results for input(s): HGBA1C in the last 72 hours. Lipid Profile No results for input(s): CHOL, HDL, LDLCALC, TRIG, CHOLHDL, LDLDIRECT in the last 72 hours. Thyroid function studies No results for input(s): TSH, T4TOTAL, T3FREE, THYROIDAB in the last 72 hours.  Invalid input(s): FREET3 Anemia work up No results for input(s): VITAMINB12, FOLATE, FERRITIN, TIBC, IRON, RETICCTPCT in the last 72 hours. Urinalysis No results found for: COLORURINE, APPEARANCEUR, LABSPEC, PHURINE, GLUCOSEU, HGBUR, BILIRUBINUR, KETONESUR, PROTEINUR, UROBILINOGEN, NITRITE, LEUKOCYTESUR Sepsis Labs Invalid input(s): PROCALCITONIN,  WBC,  LACTICIDVEN Microbiology Recent Results (from the past 240 hour(s))  Respiratory Panel by PCR     Status: Abnormal   Collection Time: 07/16/17  5:00 PM  Result Value Ref Range Status   Adenovirus NOT DETECTED NOT DETECTED Final   Coronavirus 229E NOT DETECTED NOT DETECTED Final   Coronavirus HKU1 NOT DETECTED NOT DETECTED Final   Coronavirus NL63 NOT DETECTED NOT DETECTED Final   Coronavirus OC43 NOT DETECTED NOT DETECTED Final   Metapneumovirus NOT DETECTED NOT DETECTED Final   Rhinovirus / Enterovirus DETECTED (A) NOT DETECTED Final   Influenza A NOT DETECTED NOT DETECTED Final   Influenza B NOT DETECTED NOT DETECTED Final   Parainfluenza  Virus 1 NOT DETECTED NOT DETECTED Final  Parainfluenza Virus 2 NOT DETECTED NOT DETECTED Final   Parainfluenza Virus 3 NOT DETECTED NOT DETECTED Final   Parainfluenza Virus 4 NOT DETECTED NOT DETECTED Final   Respiratory Syncytial Virus NOT DETECTED NOT DETECTED Final   Bordetella pertussis NOT DETECTED NOT DETECTED Final   Chlamydophila pneumoniae NOT DETECTED NOT DETECTED Final   Mycoplasma pneumoniae NOT DETECTED NOT DETECTED Final    Comment: Performed at [facility], [address], [place], [place] [address]     Time coordinating discharge: 31 minutes  SIGNED:   Maxie Barb, MD  [facility] 07/18/2017, 12:16 PM  If 7PM-7AM, please contact night-coverage [email or web address] Password [email or web address]

## 2017-07-18 NOTE — Progress Notes (Addendum)
Spoke to patient about HH recommendations from the doctor. She asked that I speak with her sister who is her caregiver. Contacted [facility] and she states that they have [facility], they are currently working with her PCP to adjust and increase services. She prefers to continue with this process and not have me add anything at this time. Attending aware. No further HH needs assessed. [phone]

## 2017-09-21 ENCOUNTER — Other Ambulatory Visit: Payer: Self-pay

## 2017-09-21 ENCOUNTER — Encounter (HOSPITAL_COMMUNITY): Payer: Self-pay

## 2017-09-21 ENCOUNTER — Emergency Department (HOSPITAL_COMMUNITY)
Admission: EM | Admit: 2017-09-21 | Discharge: 2017-09-22 | Disposition: A | Discharge status: Home / Self Care | Payer: Medicare PPO | Attending: Emergency Medicine | Admitting: Emergency Medicine

## 2017-09-21 DIAGNOSIS — N289 Disorder of kidney and ureter, unspecified: Secondary | ICD-10-CM | POA: Insufficient documentation

## 2017-09-21 DIAGNOSIS — M791 Myalgia, unspecified site: Secondary | ICD-10-CM

## 2017-09-21 DIAGNOSIS — M7918 Myalgia, other site: Secondary | ICD-10-CM | POA: Diagnosis present

## 2017-09-21 DIAGNOSIS — Z87891 Personal history of nicotine dependence: Secondary | ICD-10-CM | POA: Diagnosis not present

## 2017-09-21 DIAGNOSIS — I10 Essential (primary) hypertension: Secondary | ICD-10-CM | POA: Insufficient documentation

## 2017-09-21 DIAGNOSIS — Z794 Long term (current) use of insulin: Secondary | ICD-10-CM | POA: Insufficient documentation

## 2017-09-21 DIAGNOSIS — R252 Cramp and spasm: Secondary | ICD-10-CM | POA: Diagnosis not present

## 2017-09-21 DIAGNOSIS — E119 Type 2 diabetes mellitus without complications: Secondary | ICD-10-CM | POA: Insufficient documentation

## 2017-09-21 DIAGNOSIS — Z79899 Other long term (current) drug therapy: Secondary | ICD-10-CM | POA: Diagnosis not present

## 2017-09-21 HISTORY — DX: Legal blindness, as defined in USA: H54.8

## 2017-09-21 LAB — CBG MONITORING, ED: Glucose-Capillary: 116 mg/dL — ABNORMAL HIGH (ref 65–99)

## 2017-09-21 NOTE — ED Triage Notes (Addendum)
Patient c/o body aches, pain, and cramping of both hands and feet x 2-3 days. Patient also c/o bilateral shoulder aching. Patient is legally blind.

## 2017-09-21 NOTE — ED Provider Notes (Signed)
[facility]-EMERGENCY DEPT Provider Note   CSN: [id number] Arrival date & time: 09/21/17  1727     History   Chief Complaint Chief Complaint  Patient presents with  . Generalized Body Aches  . finger cramping  . Shoulder Pain    HPI Kristen Dennis is a 62 y.o. female.  HPI   62 year old female with history of diabetes, hypertension, legally blind, kidney disease, asthma presenting complaining of generalized body aches.  Patient accompanied by her sister who is at bedside.  For the past 3 days patient has been complaining of generalized body aches most significantly to the left side of her body including her neck, left arm, and legs.  Her sister noticed patient's left hand is "drawing up" and she was more concerned for potential stroke causing her symptoms.  Patient normally uses a walker to move about but for the past few days, she has been mostly bedbound and less active than usual.  Aside from complaining of sore throat, she denies having headache, URI symptoms, chest pain, trouble breathing, productive cough, abdominal pain, dysuria, bowel bladder changes, or focal numbness or weakness.  Past Medical History:  Diagnosis Date  . Asthma   . Diabetes mellitus without complication (HCC)   . Encephalitis   . Hypertension   . Legally blind   . Renal disease   . Stroke [facility])    2011    Patient Active Problem List   Diagnosis Date Noted  . Asthma exacerbation 07/15/2017  . Asthma exacerbation, mild 07/15/2017  . Hypertension   . Diabetes mellitus without complication (HCC)   . Asthma   . Renal disease     Past Surgical History:  Procedure Laterality Date  . CHOLECYSTECTOMY      OB History    No data available       Home Medications    Prior to Admission medications   Medication Sig Start Date End Date Taking? Authorizing Provider  albuterol (PROVENTIL HFA;VENTOLIN HFA) 108 (90 Base) MCG/ACT inhaler Inhale 2 puffs into the lungs every 6 (six)  hours as needed for wheezing or shortness of breath. 07/18/17   Maxie Barb, MD  amLODipine (NORVASC) 5 MG tablet Take 5 mg by mouth daily. 05/16/17   [provider]  donepezil (ARICEPT) 5 MG tablet Take 5 mg by mouth at bedtime. 05/16/17   [provider]  DULoxetine (CYMBALTA) 60 MG capsule Take 60 mg by mouth at bedtime. 03/14/17   [provider]  furosemide (LASIX) 40 MG tablet Take 40 mg by mouth daily.    [provider]  hydrALAZINE (APRESOLINE) 50 MG tablet Take 50 mg by mouth 3 (three) times daily. 07/10/17   [provider]  insulin glargine (LANTUS) 100 UNIT/ML injection Inject 55 Units into the skin at bedtime.     [provider]  lisinopril (PRINIVIL,ZESTRIL) 10 MG tablet Take 10 mg by mouth at bedtime.  07/10/17   [provider]  metFORMIN (GLUCOPHAGE) 500 MG tablet Take 1,000 mg by mouth 2 (two) times daily. 05/18/17   [provider]  metoprolol tartrate (LOPRESSOR) 50 MG tablet Take 50 mg by mouth 2 (two) times daily. 07/10/17   [provider]  montelukast (SINGULAIR) 10 MG tablet Take 10 mg by mouth at bedtime. 07/10/17   [provider]  omeprazole (PRILOSEC) 20 MG capsule Take 20 mg by mouth every evening.  04/14/17   [provider]  potassium chloride SA (K-DUR,KLOR-CON) 20 MEQ tablet Take  20 mEq by mouth daily.    [provider]  pregabalin (LYRICA) 100 MG capsule Take 100 mg by mouth 3 (three) times daily. 03/14/17   [provider]    Family History Family History  Problem Relation Age of Onset  . Cancer Mother   . Heart failure Father     Social History Social History   Tobacco Use  . Smoking status: Former Games developer  . Smokeless tobacco: Never Used  Substance Use Topics  . Alcohol use: No  . Drug use: No     Allergies   Patient has no known allergies.   Review of Systems Review of Systems  All other systems reviewed and are  negative.    Physical Exam Updated Vital Signs BP (!) 163/73 (BP Location: Right Arm)   Pulse 66   Temp 97.7 F (36.5 C) (Oral)   Resp 18   Ht 5\' 2"  (1.575 m)   Wt 94.3 kg (208 lb)   LMP 05/03/2012   SpO2 98%   BMI 38.04 kg/m   Physical Exam  Constitutional: She appears well-developed and well-nourished. No distress.  HENT:  Head: Atraumatic.  Ears: TMs normal bilaterally Nose: Normal nares Throat: Uvula midline no tonsillar enlargement or exudate  Eyes: Conjunctivae are normal.  Neck: Neck supple.  No nuchal rigidity, no carotid bruit  Cardiovascular: Normal rate and regular rhythm.  Pulmonary/Chest: Effort normal and breath sounds normal.  Abdominal: Soft. She exhibits no distension. There is no tenderness.  Musculoskeletal: She exhibits tenderness (Tenderness throughout left arm on gentle palpation without any overlying skin changes, normal arm compartment, radial pulse 2+, brisk cap refill to all fingers.).  Moving all 4 extremities with slight tremors to the left upper extremities.  Equal grip strength bilaterally.  Neurological: She is alert.  Alert to self and place but got her dates mixed up.  Skin: No rash noted.  Psychiatric: She has a normal mood and affect.  Nursing note and vitals reviewed.    ED Treatments / Results  Labs (all labs ordered are listed, but only abnormal results are displayed) Labs Reviewed  COMPREHENSIVE METABOLIC PANEL - Abnormal; Notable for the following components:      Result Value   Glucose, Bld 166 (*)    Albumin 3.1 (*)    Total Bilirubin 0.1 (*)    All other components within normal limits  CBC WITH DIFFERENTIAL/PLATELET - Abnormal; Notable for the following components:   RDW 16.1 (*)    All other components within normal limits  CBG MONITORING, ED - Abnormal; Notable for the following components:   Glucose-Capillary 116 (*)    All other components within normal limits  RAPID STREP SCREEN (NOT AT [facility])  CULTURE, GROUP A  STREP ([facility])  CK  URINALYSIS, ROUTINE W REFLEX MICROSCOPIC    EKG  EKG Interpretation None       Radiology Ct Head Wo Contrast  Result Date: 09/22/2017 CLINICAL DATA:  Altered mental status EXAM: CT HEAD WITHOUT CONTRAST TECHNIQUE: Contiguous axial images were obtained from the base of the skull through the vertex without intravenous contrast. COMPARISON:  Head CT 10/28/2015 FINDINGS: Brain: Unchanged right occipital and left parieto-occipital temporal encephalomalacia. No intracranial hemorrhage or other extra-axial fluid collection. No evidence of acute cortical infarct. There is periventricular hypoattenuation compatible with chronic microvascular disease. Vascular: No hyperdense vessel or unexpected vascular calcification. Skull: Normal visualized skull base, calvarium and extracranial soft tissues. Sinuses/Orbits: Right maxillary retention cyst.  Normal orbits. IMPRESSION: Unchanged encephalomalacia pattern  secondary to remote bilateral infarcts without acute intracranial abnormality. Electronically Signed   By: Deatra RobinsonKevin  Herman M.D.   On: 09/22/2017 02:14    Procedures Procedures (including critical care time)  Medications Ordered in ED Medications - No data to display   Initial Impression / Assessment and Plan / ED Course  I have reviewed the triage vital signs and the nursing notes.  Pertinent labs & imaging results that were available during my care of the patient were reviewed by me and considered in my medical decision making (see chart for details).     BP (!) 183/72   Pulse 62   Temp 97.7 F (36.5 C) (Oral)   Resp 19   Ht 5\' 2"  (1.575 m)   Wt 94.3 kg (208 lb)   LMP 05/03/2012   SpO2 100%   BMI 38.04 kg/m    Final Clinical Impressions(s) / ED Diagnoses   Final diagnoses:  Myalgia    ED Discharge Orders        Ordered    pregabalin (LYRICA) 100 MG capsule  3 times daily     09/22/17 0259     11:43 PM Patient who is legally blind here with complaints  of generalized body aches.  Aside from sore throat, no other infectious symptoms were noted.  Sister voiced concern of potential stroke since patient "have not been acting herself lately" for the past 3 days.  No obvious focal deficit noted.  2:55 AM Workup has been unremarkable.  Negative strep test, labs are mostly reassuring, no leukocytosis, normal H&H, normal CK, head CT scan without acute finding.  At this time, I have low suspicion for acute emergent medical condition requiring hospital admission.  I encouraged patient to follow-up with primary care provider for further evaluation of her condition.  Return precautions discussed.   Fayrene Helperran, Karli Wickizer, PA-C 09/22/17 0301    Ward, Layla MawKristen N, DO 09/22/17 [id number]

## 2017-09-22 ENCOUNTER — Emergency Department (HOSPITAL_COMMUNITY): Payer: Medicare PPO

## 2017-09-22 DIAGNOSIS — M7918 Myalgia, other site: Secondary | ICD-10-CM | POA: Diagnosis not present

## 2017-09-22 LAB — COMPREHENSIVE METABOLIC PANEL
ALT: 46 U/L (ref 14–54)
AST: 35 U/L (ref 15–41)
Albumin: 3.1 g/dL — ABNORMAL LOW (ref 3.5–5.0)
Alkaline Phosphatase: 91 U/L (ref 38–126)
Anion gap: 9 (ref 5–15)
BUN: 12 mg/dL (ref 6–20)
CHLORIDE: 102 mmol/L (ref 101–111)
CO2: 28 mmol/L (ref 22–32)
Calcium: 9 mg/dL (ref 8.9–10.3)
Creatinine, Ser: 0.98 mg/dL (ref 0.44–1.00)
Glucose, Bld: 166 mg/dL — ABNORMAL HIGH (ref 65–99)
POTASSIUM: 4.4 mmol/L (ref 3.5–5.1)
Sodium: 139 mmol/L (ref 135–145)
Total Bilirubin: 0.1 mg/dL — ABNORMAL LOW (ref 0.3–1.2)
Total Protein: 7.3 g/dL (ref 6.5–8.1)

## 2017-09-22 LAB — CBC WITH DIFFERENTIAL/PLATELET
Basophils Absolute: 0 10*3/uL (ref 0.0–0.1)
Basophils Relative: 0 %
EOS ABS: 0.1 10*3/uL (ref 0.0–0.7)
EOS PCT: 2 %
HCT: 44 % (ref 36.0–46.0)
HEMOGLOBIN: 13.8 g/dL (ref 12.0–15.0)
LYMPHS ABS: 2.4 10*3/uL (ref 0.7–4.0)
LYMPHS PCT: 38 %
MCH: 27.1 pg (ref 26.0–34.0)
MCHC: 31.4 g/dL (ref 30.0–36.0)
MCV: 86.3 fL (ref 78.0–100.0)
Monocytes Absolute: 0.7 10*3/uL (ref 0.1–1.0)
Monocytes Relative: 11 %
Neutro Abs: 3.1 10*3/uL (ref 1.7–7.7)
Neutrophils Relative %: 49 %
PLATELETS: 358 10*3/uL (ref 150–400)
RBC: 5.1 MIL/uL (ref 3.87–5.11)
RDW: 16.1 % — ABNORMAL HIGH (ref 11.5–15.5)
WBC: 6.3 10*3/uL (ref 4.0–10.5)

## 2017-09-22 LAB — CK: CK TOTAL: 99 U/L (ref 38–234)

## 2017-09-22 LAB — RAPID STREP SCREEN (MED CTR MEBANE ONLY): Streptococcus, Group A Screen (Direct): NEGATIVE

## 2017-09-22 MED ORDER — PREGABALIN 100 MG PO CAPS: 100.0000 mg | ORAL_CAPSULE | Freq: Three times a day (TID) | ORAL | 0 refills | Status: AC

## 2017-09-22 NOTE — Discharge Instructions (Signed)
Please follow up with your doctor for further evaluation of your condition.  Take lyrica as needed for your muscle aches. Your blood pressure is high today, please have it recheck by your doctor during next visit.

## 2017-09-24 LAB — CULTURE, GROUP A STREP (THRC)

## 2018-08-02 DIAGNOSIS — I5042 Chronic combined systolic (congestive) and diastolic (congestive) heart failure: Secondary | ICD-10-CM | POA: Diagnosis not present

## 2018-08-02 DIAGNOSIS — E119 Type 2 diabetes mellitus without complications: Secondary | ICD-10-CM

## 2018-08-02 DIAGNOSIS — J09X2 Influenza due to identified novel influenza A virus with other respiratory manifestations: Secondary | ICD-10-CM

## 2018-08-02 DIAGNOSIS — J101 Influenza due to other identified influenza virus with other respiratory manifestations: Secondary | ICD-10-CM

## 2018-08-02 DIAGNOSIS — I110 Hypertensive heart disease with heart failure: Secondary | ICD-10-CM

## 2018-08-02 DIAGNOSIS — J9601 Acute respiratory failure with hypoxia: Secondary | ICD-10-CM

## 2018-08-02 DIAGNOSIS — J4541 Moderate persistent asthma with (acute) exacerbation: Secondary | ICD-10-CM

## 2018-08-03 DIAGNOSIS — J09X2 Influenza due to identified novel influenza A virus with other respiratory manifestations: Secondary | ICD-10-CM | POA: Diagnosis not present

## 2018-08-03 DIAGNOSIS — J4541 Moderate persistent asthma with (acute) exacerbation: Secondary | ICD-10-CM | POA: Diagnosis not present

## 2018-08-03 DIAGNOSIS — I5042 Chronic combined systolic (congestive) and diastolic (congestive) heart failure: Secondary | ICD-10-CM | POA: Diagnosis not present

## 2018-08-03 DIAGNOSIS — J9601 Acute respiratory failure with hypoxia: Secondary | ICD-10-CM | POA: Diagnosis not present

## 2018-08-04 DIAGNOSIS — J9601 Acute respiratory failure with hypoxia: Secondary | ICD-10-CM | POA: Diagnosis not present

## 2018-08-04 DIAGNOSIS — I5042 Chronic combined systolic (congestive) and diastolic (congestive) heart failure: Secondary | ICD-10-CM | POA: Diagnosis not present

## 2018-08-04 DIAGNOSIS — J4541 Moderate persistent asthma with (acute) exacerbation: Secondary | ICD-10-CM | POA: Diagnosis not present

## 2018-08-04 DIAGNOSIS — J09X2 Influenza due to identified novel influenza A virus with other respiratory manifestations: Secondary | ICD-10-CM | POA: Diagnosis not present

## 2018-08-05 DIAGNOSIS — J09X2 Influenza due to identified novel influenza A virus with other respiratory manifestations: Secondary | ICD-10-CM | POA: Diagnosis not present

## 2018-08-05 DIAGNOSIS — J4541 Moderate persistent asthma with (acute) exacerbation: Secondary | ICD-10-CM | POA: Diagnosis not present

## 2018-08-05 DIAGNOSIS — J9601 Acute respiratory failure with hypoxia: Secondary | ICD-10-CM | POA: Diagnosis not present

## 2018-08-05 DIAGNOSIS — I5042 Chronic combined systolic (congestive) and diastolic (congestive) heart failure: Secondary | ICD-10-CM | POA: Diagnosis not present

## 2018-08-06 DIAGNOSIS — J9601 Acute respiratory failure with hypoxia: Secondary | ICD-10-CM | POA: Diagnosis not present

## 2018-08-06 DIAGNOSIS — I5042 Chronic combined systolic (congestive) and diastolic (congestive) heart failure: Secondary | ICD-10-CM | POA: Diagnosis not present

## 2018-08-06 DIAGNOSIS — J09X2 Influenza due to identified novel influenza A virus with other respiratory manifestations: Secondary | ICD-10-CM | POA: Diagnosis not present

## 2018-08-06 DIAGNOSIS — J4541 Moderate persistent asthma with (acute) exacerbation: Secondary | ICD-10-CM | POA: Diagnosis not present

## 2020-01-18 ENCOUNTER — Inpatient Hospital Stay (HOSPITAL_COMMUNITY)
Admission: EM | Admit: 2020-01-18 | Discharge: 2020-01-23 | DRG: 291 | Disposition: A | Discharge status: Home Health | Payer: Medicare PPO | Attending: Internal Medicine | Admitting: Internal Medicine

## 2020-01-18 ENCOUNTER — Other Ambulatory Visit: Payer: Self-pay

## 2020-01-18 ENCOUNTER — Encounter (HOSPITAL_COMMUNITY): Payer: Self-pay | Admitting: Emergency Medicine

## 2020-01-18 ENCOUNTER — Emergency Department (HOSPITAL_COMMUNITY): Payer: Medicare PPO

## 2020-01-18 DIAGNOSIS — R0602 Shortness of breath: Secondary | ICD-10-CM | POA: Diagnosis not present

## 2020-01-18 DIAGNOSIS — E119 Type 2 diabetes mellitus without complications: Secondary | ICD-10-CM

## 2020-01-18 DIAGNOSIS — J9600 Acute respiratory failure, unspecified whether with hypoxia or hypercapnia: Secondary | ICD-10-CM | POA: Diagnosis present

## 2020-01-18 DIAGNOSIS — I110 Hypertensive heart disease with heart failure: Principal | ICD-10-CM | POA: Diagnosis present

## 2020-01-18 DIAGNOSIS — E1165 Type 2 diabetes mellitus with hyperglycemia: Secondary | ICD-10-CM | POA: Diagnosis not present

## 2020-01-18 DIAGNOSIS — Z8249 Family history of ischemic heart disease and other diseases of the circulatory system: Secondary | ICD-10-CM

## 2020-01-18 DIAGNOSIS — Z20822 Contact with and (suspected) exposure to covid-19: Secondary | ICD-10-CM | POA: Diagnosis present

## 2020-01-18 DIAGNOSIS — E662 Morbid (severe) obesity with alveolar hypoventilation: Secondary | ICD-10-CM | POA: Diagnosis present

## 2020-01-18 DIAGNOSIS — J449 Chronic obstructive pulmonary disease, unspecified: Secondary | ICD-10-CM

## 2020-01-18 DIAGNOSIS — F329 Major depressive disorder, single episode, unspecified: Secondary | ICD-10-CM | POA: Diagnosis present

## 2020-01-18 DIAGNOSIS — F0390 Unspecified dementia without behavioral disturbance: Secondary | ICD-10-CM

## 2020-01-18 DIAGNOSIS — I509 Heart failure, unspecified: Secondary | ICD-10-CM

## 2020-01-18 DIAGNOSIS — E1169 Type 2 diabetes mellitus with other specified complication: Secondary | ICD-10-CM | POA: Diagnosis present

## 2020-01-18 DIAGNOSIS — D72829 Elevated white blood cell count, unspecified: Secondary | ICD-10-CM

## 2020-01-18 DIAGNOSIS — Z79899 Other long term (current) drug therapy: Secondary | ICD-10-CM

## 2020-01-18 DIAGNOSIS — J9601 Acute respiratory failure with hypoxia: Secondary | ICD-10-CM

## 2020-01-18 DIAGNOSIS — I2729 Other secondary pulmonary hypertension: Secondary | ICD-10-CM | POA: Diagnosis present

## 2020-01-18 DIAGNOSIS — Z794 Long term (current) use of insulin: Secondary | ICD-10-CM

## 2020-01-18 DIAGNOSIS — D869 Sarcoidosis, unspecified: Secondary | ICD-10-CM | POA: Diagnosis present

## 2020-01-18 DIAGNOSIS — I5033 Acute on chronic diastolic (congestive) heart failure: Secondary | ICD-10-CM | POA: Diagnosis present

## 2020-01-18 DIAGNOSIS — Z809 Family history of malignant neoplasm, unspecified: Secondary | ICD-10-CM

## 2020-01-18 DIAGNOSIS — F0150 Vascular dementia without behavioral disturbance: Secondary | ICD-10-CM | POA: Diagnosis present

## 2020-01-18 DIAGNOSIS — H548 Legal blindness, as defined in USA: Secondary | ICD-10-CM | POA: Diagnosis present

## 2020-01-18 DIAGNOSIS — J9621 Acute and chronic respiratory failure with hypoxia: Secondary | ICD-10-CM | POA: Diagnosis present

## 2020-01-18 DIAGNOSIS — I152 Hypertension secondary to endocrine disorders: Secondary | ICD-10-CM | POA: Diagnosis present

## 2020-01-18 DIAGNOSIS — J441 Chronic obstructive pulmonary disease with (acute) exacerbation: Secondary | ICD-10-CM | POA: Diagnosis present

## 2020-01-18 DIAGNOSIS — I69319 Unspecified symptoms and signs involving cognitive functions following cerebral infarction: Secondary | ICD-10-CM

## 2020-01-18 DIAGNOSIS — E11649 Type 2 diabetes mellitus with hypoglycemia without coma: Secondary | ICD-10-CM | POA: Diagnosis not present

## 2020-01-18 DIAGNOSIS — I2781 Cor pulmonale (chronic): Secondary | ICD-10-CM | POA: Diagnosis present

## 2020-01-18 DIAGNOSIS — Z87891 Personal history of nicotine dependence: Secondary | ICD-10-CM

## 2020-01-18 DIAGNOSIS — Z6832 Body mass index (BMI) 32.0-32.9, adult: Secondary | ICD-10-CM

## 2020-01-18 DIAGNOSIS — F439 Reaction to severe stress, unspecified: Secondary | ICD-10-CM | POA: Diagnosis not present

## 2020-01-18 LAB — CBC WITH DIFFERENTIAL/PLATELET
Abs Immature Granulocytes: 0.32 10*3/uL — ABNORMAL HIGH (ref 0.00–0.07)
Basophils Absolute: 0.1 10*3/uL (ref 0.0–0.1)
Basophils Relative: 0 %
Eosinophils Absolute: 0.1 10*3/uL (ref 0.0–0.5)
Eosinophils Relative: 0 %
HCT: 41.8 % (ref 36.0–46.0)
Hemoglobin: 12.3 g/dL (ref 12.0–15.0)
Immature Granulocytes: 1 %
Lymphocytes Relative: 9 %
Lymphs Abs: 2.2 10*3/uL (ref 0.7–4.0)
MCH: 22.2 pg — ABNORMAL LOW (ref 26.0–34.0)
MCHC: 29.4 g/dL — ABNORMAL LOW (ref 30.0–36.0)
MCV: 75.3 fL — ABNORMAL LOW (ref 80.0–100.0)
Monocytes Absolute: 1.5 10*3/uL — ABNORMAL HIGH (ref 0.1–1.0)
Monocytes Relative: 6 %
Neutro Abs: 19.6 10*3/uL — ABNORMAL HIGH (ref 1.7–7.7)
Neutrophils Relative %: 84 %
Platelets: 512 10*3/uL — ABNORMAL HIGH (ref 150–400)
RBC: 5.55 MIL/uL — ABNORMAL HIGH (ref 3.87–5.11)
RDW: 22.8 % — ABNORMAL HIGH (ref 11.5–15.5)
WBC: 23.7 10*3/uL — ABNORMAL HIGH (ref 4.0–10.5)
nRBC: 0.5 % — ABNORMAL HIGH (ref 0.0–0.2)

## 2020-01-18 LAB — I-STAT VENOUS BLOOD GAS, ED
Acid-base deficit: 6 mmol/L — ABNORMAL HIGH (ref 0.0–2.0)
Bicarbonate: 21.4 mmol/L (ref 20.0–28.0)
Calcium, Ion: 1.07 mmol/L — ABNORMAL LOW (ref 1.15–1.40)
HCT: 43 % (ref 36.0–46.0)
Hemoglobin: 14.6 g/dL (ref 12.0–15.0)
O2 Saturation: 55 %
Potassium: 4.3 mmol/L (ref 3.5–5.1)
Sodium: 139 mmol/L (ref 135–145)
TCO2: 23 mmol/L (ref 22–32)
pCO2, Ven: 46.2 mmHg (ref 44.0–60.0)
pH, Ven: 7.275 (ref 7.250–7.430)
pO2, Ven: 33 mmHg (ref 32.0–45.0)

## 2020-01-18 LAB — COMPREHENSIVE METABOLIC PANEL
ALT: 19 U/L (ref 0–44)
AST: 29 U/L (ref 15–41)
Albumin: 2.4 g/dL — ABNORMAL LOW (ref 3.5–5.0)
Alkaline Phosphatase: 106 U/L (ref 38–126)
Anion gap: 14 (ref 5–15)
BUN: 12 mg/dL (ref 8–23)
CO2: 19 mmol/L — ABNORMAL LOW (ref 22–32)
Calcium: 8.4 mg/dL — ABNORMAL LOW (ref 8.9–10.3)
Chloride: 105 mmol/L (ref 98–111)
Creatinine, Ser: 1.21 mg/dL — ABNORMAL HIGH (ref 0.44–1.00)
GFR calc Af Amer: 54 mL/min — ABNORMAL LOW (ref >60)
GFR calc non Af Amer: 47 mL/min — ABNORMAL LOW (ref >60)
Glucose, Bld: 236 mg/dL — ABNORMAL HIGH (ref 70–99)
Potassium: 4.4 mmol/L (ref 3.5–5.1)
Sodium: 138 mmol/L (ref 135–145)
Total Bilirubin: 0.8 mg/dL (ref 0.3–1.2)
Total Protein: 6.8 g/dL (ref 6.5–8.1)

## 2020-01-18 LAB — SARS CORONAVIRUS 2 BY RT PCR (HOSPITAL ORDER, PERFORMED IN ~~LOC~~ HOSPITAL LAB): SARS Coronavirus 2: NEGATIVE

## 2020-01-18 LAB — BRAIN NATRIURETIC PEPTIDE: B Natriuretic Peptide: 598 pg/mL — ABNORMAL HIGH (ref 0.0–100.0)

## 2020-01-18 MED ORDER — METHYLPREDNISOLONE SODIUM SUCC 125 MG IJ SOLR
125.0000 mg | Freq: Once | INTRAMUSCULAR | Status: AC
  Administered 2020-01-18: 125 mg via INTRAVENOUS
  Filled 2020-01-18: qty 2

## 2020-01-18 MED ORDER — FUROSEMIDE 10 MG/ML IJ SOLN
80.0000 mg | Freq: Once | INTRAMUSCULAR | Status: AC
  Administered 2020-01-18: 80 mg via INTRAVENOUS
  Filled 2020-01-18: qty 8

## 2020-01-18 MED ORDER — ALBUTEROL SULFATE (2.5 MG/3ML) 0.083% IN NEBU
2.5000 mg | INHALATION_SOLUTION | RESPIRATORY_TRACT | Status: DC | PRN
  Administered 2020-01-18: 2.5 mg via RESPIRATORY_TRACT
  Filled 2020-01-18: qty 3

## 2020-01-18 NOTE — ED Triage Notes (Signed)
Patient arrived with EMS from home with worsening SOB this evening , she received 2 Duoneb treatment , Versed 2.5 mg IM and Magnesium 2 grams IV by EMS prior to arrival . Placed on a BIPAP by RT at arrival with relief .

## 2020-01-18 NOTE — ED Provider Notes (Signed)
[facility] EMERGENCY DEPARTMENT Provider Note   CSN: [id number] Arrival date & time: 01/18/20  2055     History Chief Complaint  Patient presents with  . Shortness of Breath    BIPAP    Kristen Dennis is a 64 y.o. female.  HPI     Past Medical History:  Diagnosis Date  . Asthma   . Diabetes mellitus without complication (HCC)   . Encephalitis   . Hypertension   . Legally blind   . Renal disease   . Stroke [facility])    2011    Patient Active Problem List   Diagnosis Date Noted  . Acute on chronic diastolic CHF (congestive heart failure) (HCC) 01/19/2020  . COPD (chronic obstructive pulmonary disease) (HCC) 01/19/2020  . Dementia (HCC) 01/19/2020  . Leukocytosis 01/19/2020  . Asthma exacerbation 07/15/2017  . Asthma exacerbation, mild 07/15/2017  . Hypertension associated with diabetes (HCC)   . Diabetes mellitus without complication (HCC)   . Asthma   . Renal disease     Past Surgical History:  Procedure Laterality Date  . CHOLECYSTECTOMY       OB History   No obstetric history on file.     Family History  Problem Relation Age of Onset  . Cancer Mother   . Heart failure Father     Social History   Tobacco Use  . Smoking status: Former Games developer  . Smokeless tobacco: Never Used  Vaping Use  . Vaping Use: Never used  Substance Use Topics  . Alcohol use: No  . Drug use: No    Home Medications Prior to Admission medications   Medication Sig Start Date End Date Taking? Authorizing Provider  albuterol (PROVENTIL HFA;VENTOLIN HFA) 108 (90 Base) MCG/ACT inhaler Inhale 2 puffs into the lungs every 6 (six) hours as needed for wheezing or shortness of breath. 07/18/17   Maxie Barb, MD  amLODipine (NORVASC) 5 MG tablet Take 5 mg by mouth daily. 05/16/17   [provider]  donepezil (ARICEPT) 5 MG tablet Take 5 mg by mouth at bedtime. 05/16/17   [provider]  DULoxetine (CYMBALTA) 60 MG capsule Take 60 mg by mouth  at bedtime. 03/14/17   [provider]  furosemide (LASIX) 40 MG tablet Take 40 mg by mouth daily.    [provider]  hydrALAZINE (APRESOLINE) 50 MG tablet Take 50 mg by mouth 3 (three) times daily. 07/10/17   [provider]  insulin glargine (LANTUS) 100 UNIT/ML injection Inject 55 Units into the skin at bedtime.     [provider]  lisinopril (PRINIVIL,ZESTRIL) 10 MG tablet Take 10 mg by mouth at bedtime.  07/10/17   [provider]  metFORMIN (GLUCOPHAGE) 500 MG tablet Take 1,000 mg by mouth 2 (two) times daily. 05/18/17   [provider]  metoprolol tartrate (LOPRESSOR) 50 MG tablet Take 50 mg by mouth 2 (two) times daily. 07/10/17   [provider]  montelukast (SINGULAIR) 10 MG tablet Take 10 mg by mouth at bedtime. 07/10/17   [provider]  omeprazole (PRILOSEC) 20 MG capsule Take 20 mg by mouth every evening.  04/14/17   [provider]  potassium chloride SA (K-DUR,KLOR-CON) 20 MEQ tablet Take 20 mEq by mouth daily.    [provider]  pregabalin (LYRICA) 100 MG capsule Take 1 capsule (100 mg total) by mouth 3 (three) times daily. 09/22/17   Fayrene Helper, PA-C    Allergies    Patient has  no known allergies.  Review of Systems   Review of Systems  Physical Exam Updated Vital Signs BP 108/74 (BP Location: Left Arm)   Pulse 82   Temp 97.8 F (36.6 C) (Temporal)   Resp 16   Ht 1.676 m (5\' 6" )   Wt 90 kg   LMP 05/03/2012   SpO2 100%   BMI 32.02 kg/m   Physical Exam  ED Results / Procedures / Treatments   Labs (all labs ordered are listed, but only abnormal results are displayed) Labs Reviewed  CBC WITH DIFFERENTIAL/PLATELET - Abnormal; Notable for the following components:      Result Value   WBC 23.7 (*)    RBC 5.55 (*)    MCV 75.3 (*)    MCH 22.2 (*)    MCHC 29.4 (*)    RDW 22.8 (*)    Platelets 512 (*)    nRBC 0.5 (*)    Neutro Abs 19.6 (*)    Monocytes Absolute 1.5 (*)      Abs Immature Granulocytes 0.32 (*)    All other components within normal limits  COMPREHENSIVE METABOLIC PANEL - Abnormal; Notable for the following components:   CO2 19 (*)    Glucose, Bld 236 (*)    Creatinine, Ser 1.21 (*)    Calcium 8.4 (*)    Albumin 2.4 (*)    GFR calc non Af Amer 47 (*)    GFR calc Af Amer 54 (*)    All other components within normal limits  BRAIN NATRIURETIC PEPTIDE - Abnormal; Notable for the following components:   B Natriuretic Peptide 598.0 (*)    All other components within normal limits  I-STAT VENOUS BLOOD GAS, ED - Abnormal; Notable for the following components:   Acid-base deficit 6.0 (*)    Calcium, Ion 1.07 (*)    All other components within normal limits  SARS CORONAVIRUS 2 BY RT PCR (HOSPITAL ORDER, PERFORMED IN  HOSPITAL LAB)  HIV ANTIBODY (ROUTINE TESTING W REFLEX)  BASIC METABOLIC PANEL  MAGNESIUM  CBC  HEMOGLOBIN A1C    EKG EKG Interpretation  Date/Time:  Saturday January 18 2020 21:01:42 EDT Ventricular Rate:  117 PR Interval:    QRS Duration: 96 QT Interval:  344 QTC Calculation: 476 R Axis:   143 Text Interpretation: Sinus tachycardia Right axis deviation Low voltage, precordial leads Borderline repolarization abnormality Baseline wander in lead(s) V1 V6 Confirmed by 10[birth year]30[birth year]1995 [phone]) on 01/18/2020 9:15:16 PM   Radiology DG Chest Portable 1 View  Result Date: 01/18/2020 CLINICAL DATA:  Worsening shortness of breath EXAM: PORTABLE CHEST 1 VIEW COMPARISON:  08/02/2018 FINDINGS: Single frontal view of the chest demonstrates a stable cardiac silhouette. Diffuse interstitial prominence is seen throughout the lungs, with basilar predominant ground-glass airspace disease. No effusions or pneumothorax. IMPRESSION: 1. Diffuse interstitial and ground-glass opacities, greatest at the bases. Favor pulmonary edema over multifocal infection. Electronically Signed   By: 08/04/2018 M.D.   On: 01/18/2020 21:16     Procedures Procedures (including critical care time)  CRITICAL CARE Performed by: 03/20/2020 Total critical care time: 60 minutes Critical care time was exclusive of separately billable procedures and treating other patients. Critical care was necessary to treat or prevent imminent or life-threatening deterioration. Critical care was time spent personally by me on the following activities: development of treatment plan with patient and/or surrogate as well as nursing, discussions with consultants, evaluation of patient's response to treatment, examination of patient, obtaining history from patient or  surrogate, ordering and performing treatments and interventions, ordering and review of laboratory studies, ordering and review of radiographic studies, pulse oximetry and re-evaluation of patient's condition.   Medications Ordered in ED Medications  albuterol (PROVENTIL) (2.5 MG/3ML) 0.083% nebulizer solution 2.5 mg (2.5 mg Nebulization Given 01/18/20 2105)  sodium chloride flush (NS) 0.9 % injection 3 mL (has no administration in time range)  sodium chloride flush (NS) 0.9 % injection 3 mL (has no administration in time range)  0.9 %  sodium chloride infusion (has no administration in time range)  acetaminophen (TYLENOL) tablet 650 mg (has no administration in time range)  ondansetron (ZOFRAN) injection 4 mg (has no administration in time range)  enoxaparin (LOVENOX) injection 40 mg (has no administration in time range)  furosemide (LASIX) injection 40 mg (has no administration in time range)  potassium chloride 20 MEQ/15ML (10%) solution 10 mEq (has no administration in time range)  amLODipine (NORVASC) tablet 5 mg (has no administration in time range)  donepezil (ARICEPT) tablet 5 mg (has no administration in time range)  DULoxetine (CYMBALTA) DR capsule 60 mg (has no administration in time range)  hydrALAZINE (APRESOLINE) tablet 50 mg (has no administration in time range)   lisinopril (ZESTRIL) tablet 10 mg (has no administration in time range)  metoprolol tartrate (LOPRESSOR) tablet 50 mg (has no administration in time range)  montelukast (SINGULAIR) tablet 10 mg (has no administration in time range)  pregabalin (LYRICA) capsule 100 mg (has no administration in time range)  ipratropium-albuterol (DUONEB) 0.5-2.5 (3) MG/3ML nebulizer solution 3 mL (has no administration in time range)  insulin glargine (LANTUS) injection 30 Units (has no administration in time range)  insulin aspart (novoLOG) injection 0-9 Units (has no administration in time range)  methylPREDNISolone sodium succinate (SOLU-MEDROL) 125 mg/2 mL injection 125 mg (125 mg Intravenous Given 01/18/20 2101)  furosemide (LASIX) injection 80 mg (80 mg Intravenous Given 01/18/20 2157)    ED Course  I have reviewed the triage vital signs and the nursing notes.  Pertinent labs & imaging results that were available during my care of the patient were reviewed by me and considered in my medical decision making (see chart for details).    MDM Rules/Calculators/A&P                          Patient brought in by EMS.  Patient in respiratory distress.  Came in on CPAP.  Immediately switched over to BiPAP here.  Patient was very anxious.  Prior to arrival she had received nebulizer treatments x2 had received magnesium.  Not received any steroids.  [place] [address] [place] [address]  Apparently family called.  They stated she just got into trouble with breathing just shortly before EMS arrived.  Patient's oxygen saturations were very low.  Technically respiratory failure due to hypoxia.  We will switch her over to BiPAP here that made her much more comfortable.  Oxygen sats were good movement of air was good initially she had bilateral wheezing.  EMS said that she had hardly any air movement we had pretty good air movement.  Venous blood gas was done when she first arrived the results  of that were very reassuring.  Chest x-ray raise some concern for a bilateral lower lobe pulmonary edema.  The patient received some Lasix for that.  Patient has a history of CHF according to chart review.  Recently had admission back in June not locally but  for COPD exacerbation.  Not clear whether she is currently on steroids or not.  Patient had Covid infection back in December.  On her BiPAP here patient improved significantly.  She also received the Lasix.  But blood pressures came down nicely the started out at 172 systolic.  Now they are down to 142 systolic.  BNP was elevated and did not have any chest pain. Covid testing was negative.   Patient received a third DuoNeb here and received 125 mg of Solu-Medrol.  Discussed with the hospitalist who will admit to the stepdown unit we will keep patient on the BiPAP for now.  Feel that patient's exacerbation was mostly COPD.  And some component of CHF.    Final Clinical Impression(s) / ED Diagnoses Final diagnoses:  COPD exacerbation (HCC)  Acute on chronic congestive heart failure, unspecified heart failure type (HCC)  Acute respiratory failure with hypoxia [facility])    Rx / DC Orders ED Discharge Orders    None       Vanetta Mulders, MD 01/19/20 [phone]

## 2020-01-19 ENCOUNTER — Observation Stay (HOSPITAL_COMMUNITY): Payer: Medicare PPO

## 2020-01-19 DIAGNOSIS — Z809 Family history of malignant neoplasm, unspecified: Secondary | ICD-10-CM | POA: Diagnosis not present

## 2020-01-19 DIAGNOSIS — I5033 Acute on chronic diastolic (congestive) heart failure: Secondary | ICD-10-CM

## 2020-01-19 DIAGNOSIS — I2781 Cor pulmonale (chronic): Secondary | ICD-10-CM | POA: Diagnosis present

## 2020-01-19 DIAGNOSIS — E11649 Type 2 diabetes mellitus with hypoglycemia without coma: Secondary | ICD-10-CM | POA: Diagnosis not present

## 2020-01-19 DIAGNOSIS — I152 Hypertension secondary to endocrine disorders: Secondary | ICD-10-CM | POA: Diagnosis present

## 2020-01-19 DIAGNOSIS — D869 Sarcoidosis, unspecified: Secondary | ICD-10-CM | POA: Diagnosis present

## 2020-01-19 DIAGNOSIS — E662 Morbid (severe) obesity with alveolar hypoventilation: Secondary | ICD-10-CM | POA: Diagnosis present

## 2020-01-19 DIAGNOSIS — J9600 Acute respiratory failure, unspecified whether with hypoxia or hypercapnia: Secondary | ICD-10-CM | POA: Diagnosis present

## 2020-01-19 DIAGNOSIS — E1169 Type 2 diabetes mellitus with other specified complication: Secondary | ICD-10-CM | POA: Diagnosis present

## 2020-01-19 DIAGNOSIS — F439 Reaction to severe stress, unspecified: Secondary | ICD-10-CM | POA: Diagnosis not present

## 2020-01-19 DIAGNOSIS — F0150 Vascular dementia without behavioral disturbance: Secondary | ICD-10-CM | POA: Diagnosis present

## 2020-01-19 DIAGNOSIS — F0390 Unspecified dementia without behavioral disturbance: Secondary | ICD-10-CM

## 2020-01-19 DIAGNOSIS — I2729 Other secondary pulmonary hypertension: Secondary | ICD-10-CM | POA: Diagnosis present

## 2020-01-19 DIAGNOSIS — Z20822 Contact with and (suspected) exposure to covid-19: Secondary | ICD-10-CM | POA: Diagnosis present

## 2020-01-19 DIAGNOSIS — R609 Edema, unspecified: Secondary | ICD-10-CM | POA: Diagnosis not present

## 2020-01-19 DIAGNOSIS — J9621 Acute and chronic respiratory failure with hypoxia: Secondary | ICD-10-CM | POA: Diagnosis present

## 2020-01-19 DIAGNOSIS — R0602 Shortness of breath: Secondary | ICD-10-CM | POA: Diagnosis present

## 2020-01-19 DIAGNOSIS — I69319 Unspecified symptoms and signs involving cognitive functions following cerebral infarction: Secondary | ICD-10-CM | POA: Diagnosis not present

## 2020-01-19 DIAGNOSIS — J449 Chronic obstructive pulmonary disease, unspecified: Secondary | ICD-10-CM

## 2020-01-19 DIAGNOSIS — H548 Legal blindness, as defined in USA: Secondary | ICD-10-CM | POA: Diagnosis present

## 2020-01-19 DIAGNOSIS — J441 Chronic obstructive pulmonary disease with (acute) exacerbation: Secondary | ICD-10-CM | POA: Diagnosis present

## 2020-01-19 DIAGNOSIS — Z794 Long term (current) use of insulin: Secondary | ICD-10-CM | POA: Diagnosis not present

## 2020-01-19 DIAGNOSIS — Z87891 Personal history of nicotine dependence: Secondary | ICD-10-CM | POA: Diagnosis not present

## 2020-01-19 DIAGNOSIS — I110 Hypertensive heart disease with heart failure: Secondary | ICD-10-CM | POA: Diagnosis present

## 2020-01-19 DIAGNOSIS — Z79899 Other long term (current) drug therapy: Secondary | ICD-10-CM | POA: Diagnosis not present

## 2020-01-19 DIAGNOSIS — E1165 Type 2 diabetes mellitus with hyperglycemia: Secondary | ICD-10-CM | POA: Diagnosis not present

## 2020-01-19 DIAGNOSIS — Z6832 Body mass index (BMI) 32.0-32.9, adult: Secondary | ICD-10-CM | POA: Diagnosis not present

## 2020-01-19 DIAGNOSIS — Z8249 Family history of ischemic heart disease and other diseases of the circulatory system: Secondary | ICD-10-CM | POA: Diagnosis not present

## 2020-01-19 DIAGNOSIS — F329 Major depressive disorder, single episode, unspecified: Secondary | ICD-10-CM | POA: Diagnosis present

## 2020-01-19 DIAGNOSIS — D72829 Elevated white blood cell count, unspecified: Secondary | ICD-10-CM

## 2020-01-19 LAB — CBC
HCT: 40.6 % (ref 36.0–46.0)
Hemoglobin: 12.2 g/dL (ref 12.0–15.0)
MCH: 22.1 pg — ABNORMAL LOW (ref 26.0–34.0)
MCHC: 30 g/dL (ref 30.0–36.0)
MCV: 73.6 fL — ABNORMAL LOW (ref 80.0–100.0)
Platelets: 433 10*3/uL — ABNORMAL HIGH (ref 150–400)
RBC: 5.52 MIL/uL — ABNORMAL HIGH (ref 3.87–5.11)
RDW: 22.5 % — ABNORMAL HIGH (ref 11.5–15.5)
WBC: 19.4 10*3/uL — ABNORMAL HIGH (ref 4.0–10.5)
nRBC: 0.2 % (ref 0.0–0.2)

## 2020-01-19 LAB — GLUCOSE, CAPILLARY
Glucose-Capillary: 313 mg/dL — ABNORMAL HIGH (ref 70–99)
Glucose-Capillary: 215 mg/dL — ABNORMAL HIGH (ref 70–99)
Glucose-Capillary: 181 mg/dL — ABNORMAL HIGH (ref 70–99)
Glucose-Capillary: 199 mg/dL — ABNORMAL HIGH (ref 70–99)

## 2020-01-19 LAB — BLOOD GAS, ARTERIAL
Acid-Base Excess: 0.5 mmol/L (ref 0.0–2.0)
Bicarbonate: 24.1 mmol/L (ref 20.0–28.0)
Drawn by: 441371
FIO2: 45
O2 Saturation: 93.4 %
Patient temperature: 36.5
pCO2 arterial: 34.7 mmHg (ref 32.0–48.0)
pH, Arterial: 7.454 — ABNORMAL HIGH (ref 7.350–7.450)
pO2, Arterial: 68.8 mmHg — ABNORMAL LOW (ref 83.0–108.0)

## 2020-01-19 LAB — BASIC METABOLIC PANEL
Anion gap: 15 (ref 5–15)
BUN: 14 mg/dL (ref 8–23)
CO2: 18 mmol/L — ABNORMAL LOW (ref 22–32)
Calcium: 8.4 mg/dL — ABNORMAL LOW (ref 8.9–10.3)
Chloride: 103 mmol/L (ref 98–111)
Creatinine, Ser: 1.21 mg/dL — ABNORMAL HIGH (ref 0.44–1.00)
GFR calc Af Amer: 54 mL/min — ABNORMAL LOW (ref >60)
GFR calc non Af Amer: 47 mL/min — ABNORMAL LOW (ref >60)
Glucose, Bld: 329 mg/dL — ABNORMAL HIGH (ref 70–99)
Potassium: 4.7 mmol/L (ref 3.5–5.1)
Sodium: 136 mmol/L (ref 135–145)

## 2020-01-19 LAB — HEMOGLOBIN A1C
Hgb A1c MFr Bld: 5.7 % — ABNORMAL HIGH (ref 4.8–5.6)
Mean Plasma Glucose: 116.89 mg/dL

## 2020-01-19 LAB — HIV ANTIBODY (ROUTINE TESTING W REFLEX): HIV Screen 4th Generation wRfx: NONREACTIVE

## 2020-01-19 LAB — MRSA PCR SCREENING: MRSA by PCR: NEGATIVE

## 2020-01-19 LAB — ECHOCARDIOGRAM COMPLETE
Height: 66 in
Weight: 3167.57 oz

## 2020-01-19 LAB — MAGNESIUM: Magnesium: 1.7 mg/dL (ref 1.7–2.4)

## 2020-01-19 MED ORDER — ENOXAPARIN SODIUM 40 MG/0.4ML ~~LOC~~ SOLN
40.0000 mg | SUBCUTANEOUS | Status: DC
  Administered 2020-01-19 – 2020-01-22 (×4): 40 mg via SUBCUTANEOUS
  Filled 2020-01-19 (×4): qty 0.4

## 2020-01-19 MED ORDER — ONDANSETRON HCL 4 MG/2ML IJ SOLN: 4.0000 mg | Freq: Four times a day (QID) | INTRAMUSCULAR | Status: DC | PRN

## 2020-01-19 MED ORDER — DONEPEZIL HCL 10 MG PO TABS
5.0000 mg | ORAL_TABLET | Freq: Every day | ORAL | Status: DC
  Administered 2020-01-19 – 2020-01-22 (×4): 5 mg via ORAL
  Filled 2020-01-19 (×5): qty 1

## 2020-01-19 MED ORDER — MONTELUKAST SODIUM 10 MG PO TABS
10.0000 mg | ORAL_TABLET | Freq: Every day | ORAL | Status: DC
  Administered 2020-01-19 – 2020-01-22 (×4): 10 mg via ORAL
  Filled 2020-01-19 (×5): qty 1

## 2020-01-19 MED ORDER — SODIUM CHLORIDE 0.9% FLUSH
3.0000 mL | Freq: Two times a day (BID) | INTRAVENOUS | Status: DC
  Administered 2020-01-19 – 2020-01-22 (×6): 3 mL via INTRAVENOUS

## 2020-01-19 MED ORDER — POTASSIUM CHLORIDE 20 MEQ/15ML (10%) PO SOLN
10.0000 meq | Freq: Every day | ORAL | Status: DC
  Administered 2020-01-19 – 2020-01-22 (×4): 10 meq via ORAL
  Filled 2020-01-19 (×4): qty 15

## 2020-01-19 MED ORDER — ACETAMINOPHEN 325 MG PO TABS
650.0000 mg | ORAL_TABLET | ORAL | Status: DC | PRN
  Administered 2020-01-19: 650 mg via ORAL
  Filled 2020-01-19: qty 2

## 2020-01-19 MED ORDER — SODIUM CHLORIDE 0.9% FLUSH: 3.0000 mL | INTRAVENOUS | Status: DC | PRN

## 2020-01-19 MED ORDER — INSULIN ASPART 100 UNIT/ML ~~LOC~~ SOLN
0.0000 [IU] | Freq: Three times a day (TID) | SUBCUTANEOUS | Status: DC
  Administered 2020-01-19: 7 [IU] via SUBCUTANEOUS

## 2020-01-19 MED ORDER — SODIUM CHLORIDE 0.9 % IV SOLN: 250.0000 mL | INTRAVENOUS | Status: DC | PRN

## 2020-01-19 MED ORDER — INSULIN ASPART 100 UNIT/ML ~~LOC~~ SOLN
0.0000 [IU] | Freq: Three times a day (TID) | SUBCUTANEOUS | Status: DC
  Administered 2020-01-19: 5 [IU] via SUBCUTANEOUS
  Administered 2020-01-19: 3 [IU] via SUBCUTANEOUS
  Administered 2020-01-20 – 2020-01-21 (×2): 2 [IU] via SUBCUTANEOUS
  Administered 2020-01-21: 8 [IU] via SUBCUTANEOUS
  Administered 2020-01-22: 5 [IU] via SUBCUTANEOUS

## 2020-01-19 MED ORDER — HYDRALAZINE HCL 50 MG PO TABS
50.0000 mg | ORAL_TABLET | Freq: Three times a day (TID) | ORAL | Status: DC
  Administered 2020-01-19 – 2020-01-23 (×13): 50 mg via ORAL
  Filled 2020-01-19 (×13): qty 1

## 2020-01-19 MED ORDER — IPRATROPIUM-ALBUTEROL 0.5-2.5 (3) MG/3ML IN SOLN: 3.0000 mL | Freq: Four times a day (QID) | RESPIRATORY_TRACT | Status: DC | PRN

## 2020-01-19 MED ORDER — INSULIN ASPART 100 UNIT/ML ~~LOC~~ SOLN
0.0000 [IU] | Freq: Every day | SUBCUTANEOUS | Status: DC
  Administered 2020-01-20 – 2020-01-21 (×2): 2 [IU] via SUBCUTANEOUS
  Administered 2020-01-22: 3 [IU] via SUBCUTANEOUS

## 2020-01-19 MED ORDER — INSULIN GLARGINE 100 UNIT/ML ~~LOC~~ SOLN
30.0000 [IU] | Freq: Every day | SUBCUTANEOUS | Status: DC
  Administered 2020-01-19: 30 [IU] via SUBCUTANEOUS
  Filled 2020-01-19 (×2): qty 0.3

## 2020-01-19 MED ORDER — METOPROLOL TARTRATE 50 MG PO TABS
50.0000 mg | ORAL_TABLET | Freq: Two times a day (BID) | ORAL | Status: DC
  Administered 2020-01-19 – 2020-01-23 (×8): 50 mg via ORAL
  Filled 2020-01-19 (×9): qty 1

## 2020-01-19 MED ORDER — INSULIN GLARGINE 100 UNIT/ML ~~LOC~~ SOLN
45.0000 [IU] | Freq: Every day | SUBCUTANEOUS | Status: DC
  Administered 2020-01-19 – 2020-01-21 (×3): 45 [IU] via SUBCUTANEOUS
  Filled 2020-01-19 (×4): qty 0.45

## 2020-01-19 MED ORDER — AMLODIPINE BESYLATE 5 MG PO TABS
5.0000 mg | ORAL_TABLET | Freq: Every day | ORAL | Status: DC
  Administered 2020-01-19 – 2020-01-23 (×5): 5 mg via ORAL
  Filled 2020-01-19 (×5): qty 1

## 2020-01-19 MED ORDER — PREGABALIN 75 MG PO CAPS
100.0000 mg | ORAL_CAPSULE | Freq: Three times a day (TID) | ORAL | Status: DC
  Administered 2020-01-19 – 2020-01-23 (×13): 100 mg via ORAL
  Filled 2020-01-19 (×13): qty 1

## 2020-01-19 MED ORDER — FUROSEMIDE 10 MG/ML IJ SOLN
40.0000 mg | Freq: Two times a day (BID) | INTRAMUSCULAR | Status: DC
  Administered 2020-01-19 – 2020-01-22 (×7): 40 mg via INTRAVENOUS
  Filled 2020-01-19 (×8): qty 4

## 2020-01-19 MED ORDER — DULOXETINE HCL 60 MG PO CPEP
60.0000 mg | ORAL_CAPSULE | Freq: Every day | ORAL | Status: DC
  Administered 2020-01-19 – 2020-01-22 (×4): 60 mg via ORAL
  Filled 2020-01-19 (×5): qty 1

## 2020-01-19 MED ORDER — LISINOPRIL 10 MG PO TABS
10.0000 mg | ORAL_TABLET | Freq: Every day | ORAL | Status: DC
  Administered 2020-01-19 – 2020-01-22 (×4): 10 mg via ORAL
  Filled 2020-01-19 (×4): qty 1

## 2020-01-19 NOTE — Plan of Care (Signed)

## 2020-01-19 NOTE — Progress Notes (Signed)
PROGRESS NOTE    Kristen Dennis  KGU:[id number] DOB: [birth year] DOA: 01/18/2020 PCP: Claudean Severance, MD    Brief Narrative:  64 year old female with history of chronic diastolic congestive heart failure, known ejection fraction 65% 04/2019, COPD with chronic hypoxic respiratory failure on 2 to 3 L oxygen at home, insulin-dependent type 2 diabetes, hypertension, depression and reported dementia presents to the emergency room for evaluation of shortness of breath.  Apparently, she was visiting her sister in [place].  Her family called EMS as she was very short of breath.  Multiple rounds of treatments, duo nebs given in route and started on CPAP. In the emergency room placed on BiPAP.  Tachycardic.  WBC 23.7.  BNP 598.   Assessment & Plan:   Principal Problem:   Acute on chronic diastolic CHF (congestive heart failure) (HCC) Active Problems:   Hypertension associated with diabetes (HCC)   Diabetes mellitus without complication (HCC)   COPD (chronic obstructive pulmonary disease) (HCC)   Dementia (HCC)   Leukocytosis  Acute on chronic hypoxic respiratory failure: Multifactorial.  Suspect diastolic CHF exacerbation, obesity hypoventilation with underlying history of COPD: Patient is still with significant symptoms.  She is using BiPAP.  Unable to ventilate without BiPAP. Continue BiPAP as needed.  Will check baseline ABG today. Continue IV Lasix 40 mg twice a day.  Intake output monitoring.  Will check echocardiogram today.  Left leg swelling present, checking for DVT.  Recent CTA at outside hospital negative for PE. Continue Singulair and as needed breathing treatments. No antibiotics, leukocytosis is likely due to stress reaction, will monitor.  Type 2 diabetes on insulin: Well controlled at home.  Elevated now.  Increased dose of Lantus.  Keep on sliding scale insulin.  Holding Metformin.  Hypertension: Blood pressures better on amlodipine, hydralazine, lisinopril low  pressure.  Depression/dementia: Continue Cymbalta and donepezil.   DVT prophylaxis: enoxaparin (LOVENOX) injection 40 mg Start: 01/19/20 1000   Code Status: Full code Family Communication: Called patient's sister, unable to pick up the phone, voicemail is full and not receiving any messages. Disposition Plan: Status is: Observation  The patient will require care spanning > 2 midnights and should be moved to inpatient because: Altered mental status, IV treatments appropriate due to intensity of illness or inability to take PO and Inpatient level of care appropriate due to severity of illness  Dispo: The patient is from: Home              Anticipated d/c is to: Home              Anticipated d/c date is: 2 days              Patient currently is not medically stable to d/c.         Consultants:   None  Procedures:   None  Antimicrobials:   None   Subjective: Patient seen and examined.  Overnight he stayed on BiPAP.  Nursing tried to wean him off the BiPAP, she desatted to 80. Patient herself is poor historian.  She was on BiPAP and was able to tell me that she is fine.  Remains afebrile.  No family at bedside. She does use oxygen at home, she was not able to tell me whether she has a CPAP or BiPAP at home.  Objective: Vitals:   01/19/20 0342 01/19/20 0710 01/19/20 0810 01/19/20 1044  BP: (!) 170/85  128/75   Pulse: 95 95 90 88  Resp: (!) 25 (!) 22 (!) 21  17  Temp: 98.2 F (36.8 C)  97.7 F (36.5 C)   TempSrc: Oral  Axillary   SpO2: 96% 96% 94% 92%  Weight:      Height:        Intake/Output Summary (Last 24 hours) at 01/19/2020 1050 Last data filed at 01/19/2020 0158 Gross per 24 hour  Intake 3 ml  Output 500 ml  Net -497 ml   Filed Weights   01/18/20 2057 01/19/20 0141  Weight: 90 kg 89.8 kg    Examination:  General exam: Appears chronically sick, currently on BiPAP 40% FiO2 and looks comfortable. Respiratory system: No added sounds.  No wheezing or  crepitus. Cardiovascular system: S1 & S2 heard, RRR.  Left leg 1+ pedal edema.  Right leg no edema. Gastrointestinal system: Abdomen is nondistended, soft and nontender. No organomegaly or masses felt. Normal bowel sounds heard. Central nervous system: Alert on stimulation.  Sleepy otherwise. Extremities: Symmetric 5 x 5 power.  Moves all extremities.    Data Reviewed: I have personally reviewed following labs and imaging studies  CBC: Recent Labs  Lab 01/18/20 2059 01/18/20 2103 01/19/20 0225  WBC 23.7*  --  19.4*  NEUTROABS 19.6*  --   --   HGB 12.3 14.6 12.2  HCT 41.8 43.0 40.6  MCV 75.3*  --  73.6*  PLT 512*  --  433*   Basic Metabolic Panel: Recent Labs  Lab 01/18/20 2059 01/18/20 2103 01/19/20 0225  NA 138 139 136  K 4.4 4.3 4.7  CL 105  --  103  CO2 19*  --  18*  GLUCOSE 236*  --  329*  BUN 12  --  14  CREATININE 1.21*  --  1.21*  CALCIUM 8.4*  --  8.4*  MG  --   --  1.7   GFR: Estimated Creatinine Clearance: 52.3 mL/min (A) (by C-G formula based on SCr of 1.21 mg/dL (H)). Liver Function Tests: Recent Labs  Lab 01/18/20 2059  AST 29  ALT 19  ALKPHOS 106  BILITOT 0.8  PROT 6.8  ALBUMIN 2.4*   No results for input(s): LIPASE, AMYLASE in the last 168 hours. No results for input(s): AMMONIA in the last 168 hours. Coagulation Profile: No results for input(s): INR, PROTIME in the last 168 hours. Cardiac Enzymes: No results for input(s): CKTOTAL, CKMB, CKMBINDEX, TROPONINI in the last 168 hours. BNP (last 3 results) No results for input(s): PROBNP in the last 8760 hours. HbA1C: Recent Labs    01/19/20 0225  HGBA1C 5.7*   CBG: Recent Labs  Lab 01/19/20 0646  GLUCAP 313*   Lipid Profile: No results for input(s): CHOL, HDL, LDLCALC, TRIG, CHOLHDL, LDLDIRECT in the last 72 hours. Thyroid Function Tests: No results for input(s): TSH, T4TOTAL, FREET4, T3FREE, THYROIDAB in the last 72 hours. Anemia Panel: No results for input(s): VITAMINB12,  FOLATE, FERRITIN, TIBC, IRON, RETICCTPCT in the last 72 hours. Sepsis Labs: No results for input(s): PROCALCITON, LATICACIDVEN in the last 168 hours.  Recent Results (from the past 240 hour(s))  SARS Coronavirus 2 by RT PCR (hospital order, performed in [facility] hospital lab) Nasopharyngeal Nasopharyngeal Swab     Status: None   Collection Time: 01/18/20  9:20 PM   Specimen: Nasopharyngeal Swab  Result Value Ref Range Status   SARS Coronavirus 2 NEGATIVE NEGATIVE Final    Comment: (NOTE) SARS-CoV-2 target nucleic acids are NOT DETECTED.  The SARS-CoV-2 RNA is generally detectable in upper and lower respiratory specimens during the acute phase  of infection. The lowest concentration of SARS-CoV-2 viral copies this assay can detect is 250 copies / mL. A negative result does not preclude SARS-CoV-2 infection and should not be used as the sole basis for treatment or other patient management decisions.  A negative result may occur with improper specimen collection / handling, submission of specimen other than nasopharyngeal swab, presence of viral mutation(s) within the areas targeted by this assay, and inadequate number of viral copies (<250 copies / mL). A negative result must be combined with clinical observations, patient history, and epidemiological information.  Fact Sheet for Patients:   BoilerBrush.com.cy  Fact Sheet for Healthcare Providers: https://pope.com/  This test is not yet approved or  cleared by the [place] FDA and has been authorized for detection and/or diagnosis of SARS-CoV-2 by FDA under an Emergency Use Authorization (EUA).  This EUA will remain in effect (meaning this test can be used) for the duration of the COVID-19 declaration under Section 564(b)(1) of the Act, 21 U.S.C. section 360bbb-3(b)(1), unless the authorization is terminated or revoked sooner.  Performed at [facility], [address], [place], [place] [address]   MRSA PCR Screening     Status: None   Collection Time: 01/19/20  1:20 AM   Specimen: Nasopharyngeal  Result Value Ref Range Status   MRSA by PCR NEGATIVE NEGATIVE Final    Comment:        The GeneXpert MRSA Assay (FDA approved for NASAL specimens only), is one component of a comprehensive MRSA colonization surveillance program. It is not intended to diagnose MRSA infection nor to guide or monitor treatment for MRSA infections. Performed at [facility], [address], [place], [place] [address]          Radiology Studies: DG Chest Portable 1 View  Result Date: 01/18/2020 CLINICAL DATA:  Worsening shortness of breath EXAM: PORTABLE CHEST 1 VIEW COMPARISON:  08/02/2018 FINDINGS: Single frontal view of the chest demonstrates a stable cardiac silhouette. Diffuse interstitial prominence is seen throughout the lungs, with basilar predominant ground-glass airspace disease. No effusions or pneumothorax. IMPRESSION: 1. Diffuse interstitial and ground-glass opacities, greatest at the bases. Favor pulmonary edema over multifocal infection. Electronically Signed   By: Sharlet Salina M.D.   On: 01/18/2020 21:16        Scheduled Meds:  amLODipine  5 mg Oral Daily   donepezil  5 mg Oral QHS   DULoxetine  60 mg Oral QHS   enoxaparin (LOVENOX) injection  40 mg Subcutaneous Q24H   furosemide  40 mg Intravenous Q12H   hydrALAZINE  50 mg Oral TID   insulin aspart  0-15 Units Subcutaneous TID WC   insulin aspart  0-5 Units Subcutaneous QHS   insulin glargine  45 Units Subcutaneous QHS   lisinopril  10 mg Oral QHS   metoprolol tartrate  50 mg Oral BID   montelukast  10 mg Oral QHS   potassium chloride  10 mEq Oral Daily   pregabalin  100 mg Oral TID   sodium chloride flush  3 mL Intravenous Q12H   Continuous Infusions:  sodium chloride       LOS: 0 days    Time spent: Additional 30 minutes.    Dorcas Carrow, MD [facility] Pager [phone]

## 2020-01-19 NOTE — Progress Notes (Signed)
  Echocardiogram 2D Echocardiogram has been performed.  Kristen Dennis F 01/19/2020, 12:01 PM

## 2020-01-19 NOTE — Progress Notes (Signed)
Patient taken off Bipap by RN at 1110 and placed on [place] HFNC 15L with humidity.  RT checked on patient at 1157. Patient states her breathing feels good, she has no increased WOB, and is no distress at this time.  RT will continue to monitor as needed.

## 2020-01-19 NOTE — H&P (Signed)
History and Physical    Kristen Dennis IRS:[id number] DOB: February 20, 1956 DOA: 01/18/2020  PCP: Claudean Severance, MD  Patient coming from: Home via EMS  I have personally briefly reviewed patient's old medical records in [facility]  Chief Complaint: Shortness of breath  HPI: Kristen Dennis is a 64 y.o. female with medical history significant for chronic diastolic CHF (EF 00% by TTE 05/10/2019), COPD, insulin-dependent type 2 diabetes, hypertension, depression, and dementia who presents to the ED for evaluation of shortness of breath.  Patient states she is visiting her sister here in [place].  She had sudden onset of severe shortness of breath not improved with her home inhalers.  She has had some dry cough and noticed recent swelling in her lower extremities.  She denies any chest pain.  EMS were called and patient was placed on CPAP, given 2 DuoNeb treatments, IM Versed 2.5 mg, 2 g magnesium and brought to the ED for further evaluation.  ED Course:  Patient was placed on BiPAP on ED arrival.  Initial vitals showed BP 172/76, pulse 123, RR 19, temp 97.8 Fahrenheit, SPO2 100% on BiPAP.  Labs are notable for WBC 23.7, hemoglobin 12.3, platelets 512,000, sodium 138, potassium 4.4, bicarb 19, BUN 12, creatinine 1.21, serum glucose 236, BNP 598.  VBG showed pH 7.275, PCO2 46.2, PO2 33.  SARS-CoV-2 PCR is negative.  Portable chest x-ray shows diffuse interstitial groundglass opacities.  Patient was given IV Solu-Medrol 125 mg, IV Lasix 80 mg and albuterol nebulizer treatment.  Hospitalist service was consulted tonight for further evaluation and management.  Patient was recently hospitalized at [facility] in [place] from 12/10/2019-12/10/2019 for acute respiratory failure hypoxia due to COPD exacerbation.  Review of Systems: All systems reviewed and are negative except as documented in history of present illness above.   Past Medical History:  Diagnosis Date  . Asthma   . Diabetes  mellitus without complication (HCC)   . Encephalitis   . Hypertension   . Legally blind   . Renal disease   . Stroke [name])    2011    Past Surgical History:  Procedure Laterality Date  . CHOLECYSTECTOMY      Social History:  reports that she has quit smoking. She has never used smokeless tobacco. She reports that she does not drink alcohol and does not use drugs.  No Known Allergies  Family History  Problem Relation Age of Onset  . Cancer Mother   . Heart failure Father      Prior to Admission medications   Medication Sig Start Date End Date Taking? Authorizing Provider  albuterol (PROVENTIL HFA;VENTOLIN HFA) 108 (90 Base) MCG/ACT inhaler Inhale 2 puffs into the lungs every 6 (six) hours as needed for wheezing or shortness of breath. 07/18/17   Maxie Barb, MD  amLODipine (NORVASC) 5 MG tablet Take 5 mg by mouth daily. 05/16/17   [provider]  donepezil (ARICEPT) 5 MG tablet Take 5 mg by mouth at bedtime. 05/16/17   [provider]  DULoxetine (CYMBALTA) 60 MG capsule Take 60 mg by mouth at bedtime. 03/14/17   [provider]  furosemide (LASIX) 40 MG tablet Take 40 mg by mouth daily.    [provider]  hydrALAZINE (APRESOLINE) 50 MG tablet Take 50 mg by mouth 3 (three) times daily. 07/10/17   [provider]  insulin glargine (LANTUS) 100 UNIT/ML injection Inject 55 Units into the skin at bedtime.     [provider]  lisinopril (PRINIVIL,ZESTRIL)  10 MG tablet Take 10 mg by mouth at bedtime.  07/10/17   [provider]  metFORMIN (GLUCOPHAGE) 500 MG tablet Take 1,000 mg by mouth 2 (two) times daily. 05/18/17   [provider]  metoprolol tartrate (LOPRESSOR) 50 MG tablet Take 50 mg by mouth 2 (two) times daily. 07/10/17   [provider]  montelukast (SINGULAIR) 10 MG tablet Take 10 mg by mouth at bedtime. 07/10/17   [provider]  omeprazole (PRILOSEC) 20 MG capsule Take 20 mg  by mouth every evening.  04/14/17   [provider]  potassium chloride SA (K-DUR,KLOR-CON) 20 MEQ tablet Take 20 mEq by mouth daily.    [provider]  pregabalin (LYRICA) 100 MG capsule Take 1 capsule (100 mg total) by mouth 3 (three) times daily. 09/22/17   Fayrene Helper, PA-C    Physical Exam: Vitals:   01/18/20 2145 01/18/20 2302 01/18/20 2317 01/19/20 0007  BP: 128/73 (!) 146/80 (!) 142/71 108/74  Pulse: 99   82  Resp: (!) 21   16  Temp:      TempSrc:      SpO2: 100%   100%  Weight:      Height:       Constitutional: Resting in bed with BiPAP in place, NAD, calm, comfortable Eyes: PERRL, lids and conjunctivae normal ENMT: Mucous membranes are moist. Posterior pharynx clear of any exudate or lesions.Normal dentition.  Neck: normal, supple, no masses. Respiratory: Distant breath sounds with faint end-expiratory wheezing bilaterally.  Normal respiratory effort while on BiPAP. No accessory muscle use.  Cardiovascular: Regular rate and rhythm, no murmurs / rubs / gallops.  +1 pitting edema left lower extremity, no significant edema right lower extremity. 2+ pedal pulses. Abdomen: no tenderness, no masses palpated. No hepatosplenomegaly. Bowel sounds positive.  Musculoskeletal: no clubbing / cyanosis. No joint deformity upper and lower extremities. Good ROM, no contractures. Normal muscle tone.  Skin: no rashes, lesions, ulcers. No induration Neurologic: CN 2-12 grossly intact. Sensation intact, Strength 5/5 in all 4.  Psychiatric: Normal judgment and insight. Alert and oriented x 3. Normal mood.   Labs on Admission: I have personally reviewed following labs and imaging studies  CBC: Recent Labs  Lab 01/18/20 2059 01/18/20 2103  WBC 23.7*  --   NEUTROABS 19.6*  --   HGB 12.3 14.6  HCT 41.8 43.0  MCV 75.3*  --   PLT 512*  --    Basic Metabolic Panel: Recent Labs  Lab 01/18/20 2059 01/18/20 2103  NA 138 139  K 4.4 4.3  CL 105  --   CO2 19*  --     GLUCOSE 236*  --   BUN 12  --   CREATININE 1.21*  --   CALCIUM 8.4*  --    GFR: Estimated Creatinine Clearance: 52.4 mL/min (A) (by C-G formula based on SCr of 1.21 mg/dL (H)). Liver Function Tests: Recent Labs  Lab 01/18/20 2059  AST 29  ALT 19  ALKPHOS 106  BILITOT 0.8  PROT 6.8  ALBUMIN 2.4*   No results for input(s): LIPASE, AMYLASE in the last 168 hours. No results for input(s): AMMONIA in the last 168 hours. Coagulation Profile: No results for input(s): INR, PROTIME in the last 168 hours. Cardiac Enzymes: No results for input(s): CKTOTAL, CKMB, CKMBINDEX, TROPONINI in the last 168 hours. BNP (last 3 results) No results for input(s): PROBNP in the last 8760 hours. HbA1C: No results for input(s): HGBA1C in the last 72 hours. CBG:  No results for input(s): GLUCAP in the last 168 hours. Lipid Profile: No results for input(s): CHOL, HDL, LDLCALC, TRIG, CHOLHDL, LDLDIRECT in the last 72 hours. Thyroid Function Tests: No results for input(s): TSH, T4TOTAL, FREET4, T3FREE, THYROIDAB in the last 72 hours. Anemia Panel: No results for input(s): VITAMINB12, FOLATE, FERRITIN, TIBC, IRON, RETICCTPCT in the last 72 hours. Urine analysis: No results found for: COLORURINE, APPEARANCEUR, LABSPEC, PHURINE, GLUCOSEU, HGBUR, BILIRUBINUR, KETONESUR, PROTEINUR, UROBILINOGEN, NITRITE, LEUKOCYTESUR  Radiological Exams on Admission: DG Chest Portable 1 View  Result Date: 01/18/2020 CLINICAL DATA:  Worsening shortness of breath EXAM: PORTABLE CHEST 1 VIEW COMPARISON:  08/02/2018 FINDINGS: Single frontal view of the chest demonstrates a stable cardiac silhouette. Diffuse interstitial prominence is seen throughout the lungs, with basilar predominant ground-glass airspace disease. No effusions or pneumothorax. IMPRESSION: 1. Diffuse interstitial and ground-glass opacities, greatest at the bases. Favor pulmonary edema over multifocal infection. Electronically Signed   By: Sharlet Salina M.D.   On:  01/18/2020 21:16    EKG: Independently reviewed. Sinus tachycardia, rate 117, wandering leads.  Rate is faster when compared to prior from January 2019.  Assessment/Plan Principal Problem:   Acute on chronic diastolic CHF (congestive heart failure) (HCC) Active Problems:   Hypertension associated with diabetes (HCC)   Diabetes mellitus without complication (HCC)   COPD (chronic obstructive pulmonary disease) (HCC)   Dementia (HCC)   Leukocytosis  Lucretia Pendley is a 64 y.o. female with medical history significant for chronic diastolic CHF (EF 56% by TTE 05/10/2019), COPD, insulin-dependent type 2 diabetes, hypertension, depression, and dementia who is admitted with suspected flash pulmonary edema from acute on chronic diastolic CHF.  Acute on chronic diastolic CHF/flash pulmonary edema: Respiratory status improving with BiPAP and initial dose of Lasix.  May have some component of COPD involvement as well.  Last EF on file 65% by echocardiogram 05/10/2019. -Continue BiPAP as needed, wean off as able -Supplemental oxygen as needed -Continue IV Lasix 40 mg twice daily -Monitor strict I/O's and daily weights -Obtain echocardiogram -Obtain LLE Doppler ultrasound to evaluate for DVT given unilateral edema  COPD: -Continue BiPAP and wean off as able as above -Continue Singulair and as needed albuterol and DuoNebs  Leukocytosis: Possibly reactive, no obvious infection at time of admission.  Will continue to monitor.  Insulin-dependent type 2 diabetes: Continue reduced home Lantus 30 units nightly, add sensitive SSI.  Hold Metformin for now.  Hypertension: Continue amlodipine, Dralzine, lisinopril, and Lopressor.  Depression/dementia: Continue Cymbalta and donepezil.  DVT prophylaxis: Lovenox Code Status: Full code, confirmed with patient Family Communication: Discussed with patient, she has discussed with family Disposition Plan: From home and likely discharge home pending  respiratory improvement Consults called: None Admission status:  Status is: Observation  The patient remains OBS appropriate and will d/c before 2 midnights.  Dispo: The patient is from: Home              Anticipated d/c is to: Home              Anticipated d/c date is: 1 day pending improvement in respiratory status and ability to wean off BiPAP/supplemental oxygen.              Patient currently is not medically stable to d/c.   Darreld Mclean MD [facility]  If 7PM-7AM, please contact night-coverage [email or web address]  01/19/2020, 12:09 AM

## 2020-01-20 ENCOUNTER — Inpatient Hospital Stay (HOSPITAL_COMMUNITY): Payer: Medicare PPO

## 2020-01-20 DIAGNOSIS — R609 Edema, unspecified: Secondary | ICD-10-CM

## 2020-01-20 LAB — GLUCOSE, CAPILLARY
Glucose-Capillary: 103 mg/dL — ABNORMAL HIGH (ref 70–99)
Glucose-Capillary: 142 mg/dL — ABNORMAL HIGH (ref 70–99)
Glucose-Capillary: 89 mg/dL (ref 70–99)
Glucose-Capillary: 219 mg/dL — ABNORMAL HIGH (ref 70–99)

## 2020-01-20 MED ORDER — PREDNISONE 20 MG PO TABS
40.0000 mg | ORAL_TABLET | Freq: Every day | ORAL | Status: DC
  Administered 2020-01-20 – 2020-01-23 (×4): 40 mg via ORAL
  Filled 2020-01-20 (×4): qty 2

## 2020-01-20 MED ORDER — IOHEXOL 350 MG/ML SOLN
75.0000 mL | Freq: Once | INTRAVENOUS | Status: AC | PRN
  Administered 2020-01-20: 75 mL via INTRAVENOUS

## 2020-01-20 NOTE — Progress Notes (Signed)
Lower extremity venous has been completed.   Preliminary results in CV Proc.   Blanch Media 01/20/2020 11:40 AM

## 2020-01-20 NOTE — Progress Notes (Signed)
PROGRESS NOTE    Kristen Dennis  TFT:[id number] DOB: April 14, 1956 DOA: 01/18/2020 PCP: Lucia Estelle, MD    Brief Narrative:  64 year old female with history of chronic diastolic congestive heart failure, known ejection fraction 65% 04/2019, COPD with chronic hypoxic respiratory failure on 2 to 3 L oxygen at home, insulin-dependent type 2 diabetes, hypertension, depression and reported dementia presents to the emergency room for evaluation of shortness of breath.  Apparently, she was visiting her sister in [place].  Her family called EMS as she was very short of breath.  Multiple rounds of treatments, duo nebs given in route and started on CPAP. In the emergency room placed on BiPAP.  Tachycardic.  WBC 23.7.  BNP 598.   Assessment & Plan:   Principal Problem:   Acute on chronic diastolic CHF (congestive heart failure) (HCC) Active Problems:   Hypertension associated with diabetes ([place])   Diabetes mellitus without complication (HCC)   COPD (chronic obstructive pulmonary disease) (HCC)   Dementia (HCC)   Leukocytosis   Respiratory failure, acute (HCC)  Acute on chronic hypoxic respiratory failure: Multifactorial.  Diastolic heart failure.  Pulmonary hypertension suspected.   right-sided ventricular hypertrophy probably early cor pulmonale.  Obesity hypoventilation with underlying history of COPD: Treated with BiPAP.  Currently clinically improving.  Remains on 10 L oxygen today.  Good response to diuresis.   Continue BiPAP as needed.  Due to significant right-sided findings on echocardiogram, will check CTA of the lungs to rule out PE.  Left lower extremity duplex is pending. Continue IV Lasix 40 mg twice a day.  Intake output monitoring.   Continue Singulair and as needed breathing treatments. No antibiotics, leukocytosis is likely due to stress reaction, will monitor.  Recheck levels tomorrow morning.  Type 2 diabetes on insulin: Well controlled at home.  Remains on Lantus and sliding  scale insulin. Holding Metformin.  Hypertension: Blood pressures better on amlodipine, hydralazine, lisinopril low pressure.  Depression/dementia: Continue Cymbalta and donepezil.   DVT prophylaxis: enoxaparin (LOVENOX) injection 40 mg Start: 01/19/20 1000   Code Status: Full code Family Communication: Sister at the bedside, 7/11  disposition Plan: Status is: Inpatient  The patient will require care spanning > 2 midnights and should be moved to inpatient because: Altered mental status, IV treatments appropriate due to intensity of illness or inability to take PO and Inpatient level of care appropriate due to severity of illness  Dispo: The patient is from: Home              Anticipated d/c is to: Home              Anticipated d/c date is: 2 days              Patient currently is not medically stable to d/c.         Consultants:   None  Procedures:   None  Antimicrobials:   None   Subjective: Patient seen and examined.  No overnight events.  She herself stated that feeling better and more comfortable.  Denies any chest pain or cough.  Afebrile. She was very emotional to hear that her heart is damaged. I have met with patient's sister at the bedside on 7/11 and discussed in detail about her health.  Apparently patient is having worsening respiratory status after episode of COVID-19 pneumonia in December 2020.  She uses 2 to 4 L of oxygen at home however frequently gets short of breath and panic attacks.  Objective: Vitals:   01/20/20  0300 01/20/20 0412 01/20/20 0726 01/20/20 0857  BP: 121/83  114/61   Pulse: 65  64 74  Resp: 14  14 (!) 22  Temp: 98.1 F (36.7 C)  97.7 F (36.5 C)   TempSrc: Oral  Oral   SpO2: 94%  97% 99%  Weight:  88.2 kg    Height:        Intake/Output Summary (Last 24 hours) at 01/20/2020 1044 Last data filed at 01/20/2020 0900 Gross per 24 hour  Intake --  Output 1800 ml  Net -1800 ml   Filed Weights   01/18/20 2057 01/19/20 0141  01/20/20 0412  Weight: 90 kg 89.8 kg 88.2 kg    Examination:  General exam: Appears chronically sick, currently on 8 L nasal cannula oxygen.  Comfortable. Respiratory system: No added sounds.  No wheezing or crepitus. Cardiovascular system: S1 & S2 heard, RRR.  Left leg 1+ pedal edema.  Right leg no edema. Gastrointestinal system: Abdomen is nondistended, soft and nontender. No organomegaly or masses felt. Normal bowel sounds heard. Central nervous system: Patient is alert and awake.  She is oriented x1-2.  She has cognitive dysfunction from previous stroke.  Anxious. Extremities: Symmetric 5 x 5 power.  Moves all extremities.    Data Reviewed: I have personally reviewed following labs and imaging studies  CBC: Recent Labs  Lab 01/18/20 2059 01/18/20 2103 01/19/20 0225  WBC 23.7*  --  19.4*  NEUTROABS 19.6*  --   --   HGB 12.3 14.6 12.2  HCT 41.8 43.0 40.6  MCV 75.3*  --  73.6*  PLT 512*  --  884*   Basic Metabolic Panel: Recent Labs  Lab 01/18/20 2059 01/18/20 2103 01/19/20 0225  NA 138 139 136  K 4.4 4.3 4.7  CL 105  --  103  CO2 19*  --  18*  GLUCOSE 236*  --  329*  BUN 12  --  14  CREATININE 1.21*  --  1.21*  CALCIUM 8.4*  --  8.4*  MG  --   --  1.7   GFR: Estimated Creatinine Clearance: 51.9 mL/min (A) (by C-G formula based on SCr of 1.21 mg/dL (H)). Liver Function Tests: Recent Labs  Lab 01/18/20 2059  AST 29  ALT 19  ALKPHOS 106  BILITOT 0.8  PROT 6.8  ALBUMIN 2.4*   No results for input(s): LIPASE, AMYLASE in the last 168 hours. No results for input(s): AMMONIA in the last 168 hours. Coagulation Profile: No results for input(s): INR, PROTIME in the last 168 hours. Cardiac Enzymes: No results for input(s): CKTOTAL, CKMB, CKMBINDEX, TROPONINI in the last 168 hours. BNP (last 3 results) No results for input(s): PROBNP in the last 8760 hours. HbA1C: Recent Labs    01/19/20 0225  HGBA1C 5.7*   CBG: Recent Labs  Lab 01/19/20 0646  01/19/20 1157 01/19/20 1639 01/19/20 2108 01/20/20 0551  GLUCAP 313* 215* 181* 199* 103*   Lipid Profile: No results for input(s): CHOL, HDL, LDLCALC, TRIG, CHOLHDL, LDLDIRECT in the last 72 hours. Thyroid Function Tests: No results for input(s): TSH, T4TOTAL, FREET4, T3FREE, THYROIDAB in the last 72 hours. Anemia Panel: No results for input(s): VITAMINB12, FOLATE, FERRITIN, TIBC, IRON, RETICCTPCT in the last 72 hours. Sepsis Labs: No results for input(s): PROCALCITON, LATICACIDVEN in the last 168 hours.  Recent Results (from the past 240 hour(s))  SARS Coronavirus 2 by RT PCR (hospital order, performed in [facility] hospital lab) Nasopharyngeal Nasopharyngeal Swab     Status: None  Collection Time: 01/18/20  9:20 PM   Specimen: Nasopharyngeal Swab  Result Value Ref Range Status   SARS Coronavirus 2 NEGATIVE NEGATIVE Final    Comment: (NOTE) SARS-CoV-2 target nucleic acids are NOT DETECTED.  The SARS-CoV-2 RNA is generally detectable in upper and lower respiratory specimens during the acute phase of infection. The lowest concentration of SARS-CoV-2 viral copies this assay can detect is 250 copies / mL. A negative result does not preclude SARS-CoV-2 infection and should not be used as the sole basis for treatment or other patient management decisions.  A negative result may occur with improper specimen collection / handling, submission of specimen other than nasopharyngeal swab, presence of viral mutation(s) within the areas targeted by this assay, and inadequate number of viral copies (<250 copies / mL). A negative result must be combined with clinical observations, patient history, and epidemiological information.  Fact Sheet for Patients:   StrictlyIdeas.no  Fact Sheet for Healthcare Providers: BankingDealers.co.za  This test is not yet approved or  cleared by the [place] FDA and has been authorized for detection  and/or diagnosis of SARS-CoV-2 by FDA under an Emergency Use Authorization (EUA).  This EUA will remain in effect (meaning this test can be used) for the duration of the COVID-19 declaration under Section 564(b)(1) of the Act, 21 U.S.C. section 360bbb-3(b)(1), unless the authorization is terminated or revoked sooner.  Performed at [facility], [place] [address] [place], [place] [address]   MRSA PCR Screening     Status: None   Collection Time: 01/19/20  1:20 AM   Specimen: Nasopharyngeal  Result Value Ref Range Status   MRSA by PCR NEGATIVE NEGATIVE Final    Comment:        The GeneXpert MRSA Assay (FDA approved for NASAL specimens only), is one component of a comprehensive MRSA colonization surveillance program. It is not intended to diagnose MRSA infection nor to guide or monitor treatment for MRSA infections. Performed at [facility], [place] [address], [place], [place] [address]          Radiology Studies: CT ANGIO CHEST PE W OR WO CONTRAST  Result Date: 01/20/2020 CLINICAL DATA:  Chest pain and shortness of breath. EXAM: CT ANGIOGRAPHY CHEST WITH CONTRAST TECHNIQUE: Multidetector CT imaging of the chest was performed using the standard protocol during bolus administration of intravenous contrast. Multiplanar CT image reconstructions and MIPs were obtained to evaluate the vascular anatomy. CONTRAST:  67m OMNIPAQUE IOHEXOL 350 MG/ML SOLN COMPARISON:  08/02/2018 FINDINGS: Cardiovascular: The heart is upper limits of normal in size and stable. No pericardial effusion. The aorta is normal in caliber. Moderate atherosclerotic calcifications but no dissection. 2 vessel coronary artery calcifications are noted. Reflux of contrast down the IVC and into the hepatic veins suggesting right heart failure or tricuspid regurgitation. The pulmonary arterial tree is well opacified. No filling defects to suggest pulmonary embolism. Mediastinum/Nodes: Persistent bulky mediastinal and  hilar lymphadenopathy. Recommend clinical correlation. Suspect process such as sarcoidosis. The esophagus is grossly normal. Lungs/Pleura: Stable significant chronic lung disease with emphysema and pulmonary scarring. There is also diffuse hazy ground-glass opacity in the lungs suggesting pulmonary edema or other partial airspace filling process such as inflammatory infectious alveolitis or hypersensitivity pneumonitis. Small to moderate-sized left pleural effusion is noted with overlying atelectasis. There is also streaky right basilar subsegmental atelectasis. Upper Abdomen: No significant upper abdominal findings. The gallbladder is surgically absent. Musculoskeletal: No significant bony findings. Review of the MIP images confirms the above  findings. IMPRESSION: 1. No CT findings for pulmonary embolism. 2. No thoracic aortic aneurysm or dissection. 3. Aortic and coronary artery calcifications. 4. Persistent bulky mediastinal and hilar lymphadenopathy. Suspect process such as sarcoidosis. 5. Stable significant chronic lung disease with emphysema and pulmonary scarring. 6. Diffuse hazy ground-glass opacity in the lungs suggesting pulmonary edema or other partial airspace filling process such as inflammatory infectious alveolitis or hypersensitivity pneumonitis. 7. Small to moderate-sized left pleural effusion with overlying atelectasis. 8. Reflux of contrast down the IVC and into the hepatic veins suggesting right heart failure or tricuspid regurgitation. 9. Emphysema and aortic atherosclerosis. Aortic Atherosclerosis (ICD10-I70.0) and Emphysema (ICD10-J43.9). Aortic Atherosclerosis (ICD10-I70.0) and Emphysema (ICD10-J43.9). Electronically Signed   By: Marijo Sanes M.D.   On: 01/20/2020 09:37   DG Chest Portable 1 View  Result Date: 01/18/2020 CLINICAL DATA:  Worsening shortness of breath EXAM: PORTABLE CHEST 1 VIEW COMPARISON:  08/02/2018 FINDINGS: Single frontal view of the chest demonstrates a stable  cardiac silhouette. Diffuse interstitial prominence is seen throughout the lungs, with basilar predominant ground-glass airspace disease. No effusions or pneumothorax. IMPRESSION: 1. Diffuse interstitial and ground-glass opacities, greatest at the bases. Favor pulmonary edema over multifocal infection. Electronically Signed   By: Randa Ngo M.D.   On: 01/18/2020 21:16   ECHOCARDIOGRAM COMPLETE  Result Date: 01/19/2020    ECHOCARDIOGRAM REPORT   Patient Name:   Kristen Dennis Date of Exam: 01/19/2020 Medical Rec #:  [id number]   Height:       66.0 in Accession #:    [id number]  Weight:       198.0 lb Date of Birth:  1956-07-06   BSA:          1.991 m Patient Age:    23 years    BP:           128/75 mmHg Patient Gender: F           HR:           92 bpm. Exam Location:  Inpatient Procedure: 2D Echo, Cardiac Doppler and Color Doppler Indications:    I50.9* Heart failure (unspecified)  History:        Patient has no prior history of Echocardiogram examinations.  Sonographer:    Merrie Roof RDCS Referring Phys: [id number] [place]  1. Left ventricular ejection fraction, by estimation, is 65 to 70%. The left ventricle has normal function. The left ventricle has no regional wall motion abnormalities. Left ventricular diastolic parameters are consistent with Grade I diastolic dysfunction (impaired relaxation). There is the interventricular septum is flattened in systole and diastole, consistent with right ventricular pressure and volume overload.  2. Right ventricular systolic function is moderately reduced. The right ventricular size is severely enlarged. There is moderately elevated pulmonary artery systolic pressure. The estimated right ventricular systolic pressure is 30.8 mmHg.  3. Right atrial size was severely dilated.  4. The mitral valve is grossly normal. No evidence of mitral valve regurgitation. No evidence of mitral stenosis.  5. The aortic valve is tricuspid. Aortic valve regurgitation is not  visualized. Mild aortic valve sclerosis is present, with no evidence of aortic valve stenosis.  6. The inferior vena cava is normal in size with <50% respiratory variability, suggesting right atrial pressure of 8 mmHg. Conclusion(s)/Recommendation(s): Findings consistent with Cor Pulmonale. Would consider acute PE if there are concerns for this clinically. FINDINGS  Left Ventricle: Left ventricular ejection fraction, by estimation, is 65 to 70%. The left ventricle has normal function. The  left ventricle has no regional wall motion abnormalities. The left ventricular internal cavity size was normal in size. There is  no left ventricular hypertrophy. The interventricular septum is flattened in systole and diastole, consistent with right ventricular pressure and volume overload. Left ventricular diastolic parameters are consistent with Grade I diastolic dysfunction (impaired relaxation). Right Ventricle: The right ventricular size is severely enlarged. No increase in right ventricular wall thickness. Right ventricular systolic function is moderately reduced. There is moderately elevated pulmonary artery systolic pressure. The tricuspid regurgitant velocity is 3.45 m/s, and with an assumed right atrial pressure of 8 mmHg, the estimated right ventricular systolic pressure is 88.8 mmHg. Left Atrium: Left atrial size was normal in size. Right Atrium: Right atrial size was severely dilated. Pericardium: Trivial pericardial effusion is present. Mitral Valve: The mitral valve is grossly normal. No evidence of mitral valve regurgitation. No evidence of mitral valve stenosis. Tricuspid Valve: The tricuspid valve is grossly normal. Tricuspid valve regurgitation is mild . No evidence of tricuspid stenosis. Aortic Valve: The aortic valve is tricuspid. . There is mild thickening and mild calcification of the aortic valve. Aortic valve regurgitation is not visualized. Mild aortic valve sclerosis is present, with no evidence of  aortic valve stenosis. There is mild thickening of the aortic valve. There is mild calcification of the aortic valve. Pulmonic Valve: The pulmonic valve was grossly normal. Pulmonic valve regurgitation is not visualized. No evidence of pulmonic stenosis. Aorta: The aortic root and ascending aorta are structurally normal, with no evidence of dilitation. Venous: The inferior vena cava is normal in size with less than 50% respiratory variability, suggesting right atrial pressure of 8 mmHg. IAS/Shunts: The atrial septum is grossly normal. Additional Comments: There is a small pleural effusion in the left lateral region.  LEFT VENTRICLE PLAX 2D LVIDd:         2.90 cm     Diastology LVIDs:         1.90 cm     LV e' lateral:   4.57 cm/s LV PW:         0.80 cm     LV E/e' lateral: 16.4 LV IVS:        1.00 cm     LV e' medial:    8.49 cm/s LVOT diam:     1.60 cm     LV E/e' medial:  8.8 LV SV:         29 LV SV Index:   15 LVOT Area:     2.01 cm  LV Volumes (MOD) LV vol d, MOD A4C: 36.3 ml LV vol s, MOD A4C: 14.1 ml LV SV MOD A4C:     36.3 ml RIGHT VENTRICLE RV Basal diam:  6.00 cm RV Mid diam:    4.70 cm RV S prime:     10.60 cm/s TAPSE (M-mode): 2.1 cm LEFT ATRIUM           Index       RIGHT ATRIUM           Index LA diam:      3.10 cm 1.56 cm/m  RA Area:     15.80 cm LA Vol (A4C): 21.1 ml 10.60 ml/m RA Volume:   43.20 ml  21.70 ml/m  AORTIC VALVE LVOT Vmax:   85.50 cm/s LVOT Vmean:  57.800 cm/s LVOT VTI:    0.145 m  AORTA Ao Root diam: 2.20 cm Ao Asc diam:  2.40 cm MITRAL VALVE  TRICUSPID VALVE MV Area (PHT): 3.42 cm     TR Peak grad:   47.6 mmHg MV Decel Time: 222 msec     TR Vmax:        345.00 cm/s MV E velocity: 75.00 cm/s MV A velocity: 102.00 cm/s  SHUNTS MV E/A ratio:  0.74         Systemic VTI:  0.14 m                             Systemic Diam: 1.60 cm Eleonore Chiquito MD Electronically signed by Eleonore Chiquito MD Signature Date/Time: 01/19/2020/2:26:01 PM    Final         Scheduled Meds:   amLODipine  5 mg Oral Daily   donepezil  5 mg Oral QHS   DULoxetine  60 mg Oral QHS   enoxaparin (LOVENOX) injection  40 mg Subcutaneous Q24H   furosemide  40 mg Intravenous Q12H   hydrALAZINE  50 mg Oral TID   insulin aspart  0-15 Units Subcutaneous TID WC   insulin aspart  0-5 Units Subcutaneous QHS   insulin glargine  45 Units Subcutaneous QHS   lisinopril  10 mg Oral QHS   metoprolol tartrate  50 mg Oral BID   montelukast  10 mg Oral QHS   potassium chloride  10 mEq Oral Daily   pregabalin  100 mg Oral TID   sodium chloride flush  3 mL Intravenous Q12H   Continuous Infusions:  sodium chloride       LOS: 1 day    Time spent: 30 minutes    Barb Merino, MD [facility] Pager [phone]

## 2020-01-21 LAB — CBC WITH DIFFERENTIAL/PLATELET
Abs Immature Granulocytes: 0.06 10*3/uL (ref 0.00–0.07)
Basophils Absolute: 0 10*3/uL (ref 0.0–0.1)
Basophils Relative: 0 %
Eosinophils Absolute: 0 10*3/uL (ref 0.0–0.5)
Eosinophils Relative: 0 %
HCT: 37 % (ref 36.0–46.0)
Hemoglobin: 10.9 g/dL — ABNORMAL LOW (ref 12.0–15.0)
Immature Granulocytes: 0 %
Lymphocytes Relative: 6 %
Lymphs Abs: 0.8 10*3/uL (ref 0.7–4.0)
MCH: 21.9 pg — ABNORMAL LOW (ref 26.0–34.0)
MCHC: 29.5 g/dL — ABNORMAL LOW (ref 30.0–36.0)
MCV: 74.3 fL — ABNORMAL LOW (ref 80.0–100.0)
Monocytes Absolute: 0.5 10*3/uL (ref 0.1–1.0)
Monocytes Relative: 3 %
Neutro Abs: 12 10*3/uL — ABNORMAL HIGH (ref 1.7–7.7)
Neutrophils Relative %: 91 %
Platelets: 354 10*3/uL (ref 150–400)
RBC: 4.98 MIL/uL (ref 3.87–5.11)
RDW: 22 % — ABNORMAL HIGH (ref 11.5–15.5)
WBC: 13.3 10*3/uL — ABNORMAL HIGH (ref 4.0–10.5)
nRBC: 0 % (ref 0.0–0.2)

## 2020-01-21 LAB — BASIC METABOLIC PANEL
Anion gap: 12 (ref 5–15)
BUN: 17 mg/dL (ref 8–23)
CO2: 24 mmol/L (ref 22–32)
Calcium: 9 mg/dL (ref 8.9–10.3)
Chloride: 103 mmol/L (ref 98–111)
Creatinine, Ser: 0.9 mg/dL (ref 0.44–1.00)
GFR calc Af Amer: >60 mL/min (ref >60)
GFR calc non Af Amer: >60 mL/min (ref >60)
Glucose, Bld: 178 mg/dL — ABNORMAL HIGH (ref 70–99)
Potassium: 4.9 mmol/L (ref 3.5–5.1)
Sodium: 139 mmol/L (ref 135–145)

## 2020-01-21 LAB — GLUCOSE, CAPILLARY
Glucose-Capillary: 125 mg/dL — ABNORMAL HIGH (ref 70–99)
Glucose-Capillary: 96 mg/dL (ref 70–99)
Glucose-Capillary: 276 mg/dL — ABNORMAL HIGH (ref 70–99)
Glucose-Capillary: 211 mg/dL — ABNORMAL HIGH (ref 70–99)

## 2020-01-21 LAB — PHOSPHORUS: Phosphorus: 4.1 mg/dL (ref 2.5–4.6)

## 2020-01-21 LAB — MAGNESIUM: Magnesium: 1.9 mg/dL (ref 1.7–2.4)

## 2020-01-21 NOTE — Progress Notes (Signed)
PROGRESS NOTE    Kristen Dennis  WUJ:[id number] DOB: October 27, 1955 DOA: 01/18/2020 PCP: Claudean Severance, MD    Brief Narrative:  64 year old female with history of chronic diastolic congestive heart failure, known ejection fraction 65% 04/2019, COPD with chronic hypoxic respiratory failure on 2 to 3 L oxygen at home, insulin-dependent type 2 diabetes, hypertension, depression and reported dementia presents to the emergency room for evaluation of shortness of breath.  Apparently, she was visiting her sister in [place].  Her family called EMS as she was very short of breath.  Multiple rounds of treatments, duo nebs given in route and started on CPAP. In the emergency room placed on BiPAP.  Tachycardic.  WBC 23.7.  BNP 598.   Assessment & Plan:   Principal Problem:   Acute on chronic diastolic CHF (congestive heart failure) (HCC) Active Problems:   Hypertension associated with diabetes (HCC)   Diabetes mellitus without complication (HCC)   COPD (chronic obstructive pulmonary disease) (HCC)   Dementia (HCC)   Leukocytosis   Respiratory failure, acute (HCC)  Acute on chronic hypoxic respiratory failure: Multifactorial. Diastolic heart failure.  Pulmonary hypertension suspected.   right-sided ventricular hypertrophy probably early cor pulmonale.  Obesity hypoventilation with underlying history of COPD: Treated with BiPAP.  Currently clinically improving.  Remains on 5-6 L oxygen today.  Good response to diuresis.   Continue BiPAP if needed.   Due to significant right-sided findings on echocardiogram, a CTA of the chest was done and compared to CTA from last month.  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   Negative for PE.  Extremity duplex is negative for DVT.   4 L negative balance since admission with improvement clinically.  Continue IV Lasix.  Will change to oral Lasix tomorrow. Probably has underlying sarcoidosis, will treat with  trial of prednisone, prolonged taper and referral to pulmonary as outpatient for follow-up. Continue Singulair and as needed breathing treatments. No antibiotics, leukocytosis is likely due to stress reaction, will monitor.  Normalizing. Start doing chest physiotherapy today.  Type 2 diabetes on insulin with hyperglycemia: Well controlled at home.  Remains on Lantus and sliding scale insulin. Holding Metformin.  Hypertension: Blood pressures better on amlodipine, hydralazine, lisinopril low pressure.  Depression/dementia: Continue Cymbalta and donepezil.   DVT prophylaxis: enoxaparin (LOVENOX) injection 40 mg Start: 01/19/20 1000   Code Status: Full code Family Communication: Sister on the phone. disposition Plan: Status is: Inpatient  The patient will require care spanning > 2 midnights and should be moved to inpatient because: Altered mental status, IV treatments appropriate due to intensity of illness or inability to take PO and Inpatient level of care appropriate due to severity of illness  Dispo: The patient is from: Home              Anticipated d/c is to: Home              Anticipated d/c date is: Tomorrow anticipated.              Patient currently is not medically stable to d/c.  Still on significant oxygen.     Consultants:   None  Procedures:   None  Antimicrobials:   None   Subjective: Patient seen and examined.  No overnight events.  Breathing overall better.  She does have some congestion.  Objective: Vitals:   01/21/20 0536 01/21/20 0719 01/21/20 0840 01/21/20 1053  BP:  (!) 111/55  119/88  Pulse:   69 (!) 57  Resp:  (!) 24 16 17   Temp:    (!) 97.4 F (36.3 C)  TempSrc:    Oral  SpO2:   99% 98%  Weight: 87.5 kg     Height:        Intake/Output Summary (Last 24 hours) at 01/21/2020 1128 Last data filed at 01/21/2020 0719 Gross per 24 hour  Intake --  Output 1700 ml  Net -1700 ml   Filed Weights   01/19/20 0141 01/20/20 0412 01/21/20 0536   Weight: 89.8 kg 88.2 kg 87.5 kg    Examination:  General exam: Appears chronically sick, currently on 6 L nasal cannula oxygen.  Looks comfortable. Respiratory system: No wheezing or crepitus.  Some conducted airway sounds. Cardiovascular system: S1 & S2 heard, RRR.  No edema. Gastrointestinal system: Abdomen is nondistended, soft and nontender. No organomegaly or masses felt. Normal bowel sounds heard. Central nervous system: Patient is alert and awake.  She is oriented x2.  She has cognitive dysfunction from previous stroke.  Anxious. Extremities: Symmetric 5 x 5 power.  Moves all extremities.    Data Reviewed: I have personally reviewed following labs and imaging studies  CBC: Recent Labs  Lab 01/18/20 2059 01/18/20 2103 01/19/20 0225 01/21/20 0300  WBC 23.7*  --  19.4* 13.3*  NEUTROABS 19.6*  --   --  12.0*  HGB 12.3 14.6 12.2 10.9*  HCT 41.8 43.0 40.6 37.0  MCV 75.3*  --  73.6* 74.3*  PLT 512*  --  433* 354   Basic Metabolic Panel: Recent Labs  Lab 01/18/20 2059 01/18/20 2103 01/19/20 0225 01/21/20 0300  NA 138 139 136 139  K 4.4 4.3 4.7 4.9  CL 105  --  103 103  CO2 19*  --  18* 24  GLUCOSE 236*  --  329* 178*  BUN 12  --  14 17  CREATININE 1.21*  --  1.21* 0.90  CALCIUM 8.4*  --  8.4* 9.0  MG  --   --  1.7 1.9  PHOS  --   --   --  4.1   GFR: Estimated Creatinine Clearance: 69.5 mL/min (by C-G formula based on SCr of 0.9 mg/dL). Liver Function Tests: Recent Labs  Lab 01/18/20 2059  AST 29  ALT 19  ALKPHOS 106  BILITOT 0.8  PROT 6.8  ALBUMIN 2.4*   No results for input(s): LIPASE, AMYLASE in the last 168 hours. No results for input(s): AMMONIA in the last 168 hours. Coagulation Profile: No results for input(s): INR, PROTIME in the last 168 hours. Cardiac Enzymes: No results for input(s): CKTOTAL, CKMB, CKMBINDEX, TROPONINI in the last 168 hours. BNP (last 3 results) No results for input(s): PROBNP in the last 8760 hours. HbA1C: Recent Labs     01/19/20 0225  HGBA1C 5.7*   CBG: Recent Labs  Lab 01/20/20 1111 01/20/20 1603 01/20/20 2122 01/21/20 0612 01/21/20 1055  GLUCAP 142* 89 219* 125* 96   Lipid Profile: No results for input(s): CHOL, HDL, LDLCALC, TRIG, CHOLHDL, LDLDIRECT in the last 72 hours. Thyroid Function Tests: No results for input(s): TSH, T4TOTAL, FREET4, T3FREE, THYROIDAB in the last 72 hours. Anemia Panel: No results for input(s): VITAMINB12, FOLATE, FERRITIN, TIBC, IRON, RETICCTPCT in the last 72 hours. Sepsis Labs: No results for input(s): PROCALCITON, LATICACIDVEN in the last 168 hours.  Recent Results (from the past 240 hour(s))  SARS Coronavirus 2 by RT PCR (hospital order, [facility]) Nasopharyngeal Nasopharyngeal Swab     Status:  None   Collection Time: 01/18/20  9:20 PM   Specimen: Nasopharyngeal Swab  Result Value Ref Range Status   SARS Coronavirus 2 NEGATIVE NEGATIVE Final    Comment: (NOTE) SARS-CoV-2 target nucleic acids are NOT DETECTED.  The SARS-CoV-2 RNA is generally detectable in upper and lower respiratory specimens during the acute phase of infection. The lowest concentration of SARS-CoV-2 viral copies this assay can detect is 250 copies / mL. A negative result does not preclude SARS-CoV-2 infection and should not be used as the sole basis for treatment or other patient management decisions.  A negative result may occur with improper specimen collection / handling, submission of specimen other than nasopharyngeal swab, presence of viral mutation(s) within the areas targeted by this assay, and inadequate number of viral copies (<250 copies / mL). A negative result must be combined with clinical observations, patient history, and epidemiological information.  Fact Sheet for Patients:   BoilerBrush.com.cy  Fact Sheet for Healthcare Providers: https://pope.com/  This test is not yet approved or   cleared by the [place] FDA and has been authorized for detection and/or diagnosis of SARS-CoV-2 by FDA under an Emergency Use Authorization (EUA).  This EUA will remain in effect (meaning this test can be used) for the duration of the COVID-19 declaration under Section 564(b)(1) of the Act, 21 U.S.C. section 360bbb-3(b)(1), unless the authorization is terminated or revoked sooner.  Performed at [facility], [address] [place], [place] [address]   MRSA PCR Screening     Status: None   Collection Time: 01/19/20  1:20 AM   Specimen: Nasopharyngeal  Result Value Ref Range Status   MRSA by PCR NEGATIVE NEGATIVE Final    Comment:        The GeneXpert MRSA Assay (FDA approved for NASAL specimens only), is one component of a comprehensive MRSA colonization surveillance program. It is not intended to diagnose MRSA infection nor to guide or monitor treatment for MRSA infections. Performed at [facility], [address], [place], [place] [address]          Radiology Studies: CT ANGIO CHEST PE W OR WO CONTRAST  Result Date: 01/20/2020 CLINICAL DATA:  Chest pain and shortness of breath. EXAM: CT ANGIOGRAPHY CHEST WITH CONTRAST TECHNIQUE: Multidetector CT imaging of the chest was performed using the standard protocol during bolus administration of intravenous contrast. Multiplanar CT image reconstructions and MIPs were obtained to evaluate the vascular anatomy. CONTRAST:  75mL OMNIPAQUE IOHEXOL 350 MG/ML SOLN COMPARISON:  08/02/2018 FINDINGS: Cardiovascular: The heart is upper limits of normal in size and stable. No pericardial effusion. The aorta is normal in caliber. Moderate atherosclerotic calcifications but no dissection. 2 vessel coronary artery calcifications are noted. Reflux of contrast down the IVC and into the hepatic veins suggesting right heart failure or tricuspid regurgitation. The pulmonary arterial tree is well opacified. No filling defects to suggest  pulmonary embolism. Mediastinum/Nodes: Persistent bulky mediastinal and hilar lymphadenopathy. Recommend clinical correlation. Suspect process such as sarcoidosis. The esophagus is grossly normal. Lungs/Pleura: Stable significant chronic lung disease with emphysema and pulmonary scarring. There is also diffuse hazy ground-glass opacity in the lungs suggesting pulmonary edema or other partial airspace filling process such as inflammatory infectious alveolitis or hypersensitivity pneumonitis. Small to moderate-sized left pleural effusion is noted with overlying atelectasis. There is also streaky right basilar subsegmental atelectasis. Upper Abdomen: No significant upper abdominal findings. The gallbladder is surgically absent. Musculoskeletal: No significant bony findings. Review of the MIP images  confirms the above findings. IMPRESSION: 1. No CT findings for pulmonary embolism. 2. No thoracic aortic aneurysm or dissection. 3. Aortic and coronary artery calcifications. 4. Persistent bulky mediastinal and hilar lymphadenopathy. Suspect process such as sarcoidosis. 5. Stable significant chronic lung disease with emphysema and pulmonary scarring. 6. Diffuse hazy ground-glass opacity in the lungs suggesting pulmonary edema or other partial airspace filling process such as inflammatory infectious alveolitis or hypersensitivity pneumonitis. 7. Small to moderate-sized left pleural effusion with overlying atelectasis. 8. Reflux of contrast down the IVC and into the hepatic veins suggesting right heart failure or tricuspid regurgitation. 9. Emphysema and aortic atherosclerosis. Aortic Atherosclerosis (ICD10-I70.0) and Emphysema (ICD10-J43.9). Aortic Atherosclerosis (ICD10-I70.0) and Emphysema (ICD10-J43.9). Electronically Signed   By: Rudie Meyer M.D.   On: 01/20/2020 09:37   ECHOCARDIOGRAM COMPLETE  Result Date: 01/19/2020    ECHOCARDIOGRAM REPORT   Patient Name:   MARTIZA Hanssen Date of Exam: 01/19/2020 Medical Rec #:   [id number]   Height:       66.0 in Accession #:    [id number]  Weight:       198.0 lb Date of Birth:  1955/08/14   BSA:          1.991 m Patient Age:    64 years    BP:           128/75 mmHg Patient Gender: F           HR:           92 bpm. Exam Location:  Inpatient Procedure: 2D Echo, Cardiac Doppler and Color Doppler Indications:    I50.9* Heart failure (unspecified)  History:        Patient has no prior history of Echocardiogram examinations.  Sonographer:    Roosvelt Maser RDCS Referring Phys: [id number] VISHAL R PATEL IMPRESSIONS  1. Left ventricular ejection fraction, by estimation, is 65 to 70%. The left ventricle has normal function. The left ventricle has no regional wall motion abnormalities. Left ventricular diastolic parameters are consistent with Grade I diastolic dysfunction (impaired relaxation). There is the interventricular septum is flattened in systole and diastole, consistent with right ventricular pressure and volume overload.  2. Right ventricular systolic function is moderately reduced. The right ventricular size is severely enlarged. There is moderately elevated pulmonary artery systolic pressure. The estimated right ventricular systolic pressure is 55.6 mmHg.  3. Right atrial size was severely dilated.  4. The mitral valve is grossly normal. No evidence of mitral valve regurgitation. No evidence of mitral stenosis.  5. The aortic valve is tricuspid. Aortic valve regurgitation is not visualized. Mild aortic valve sclerosis is present, with no evidence of aortic valve stenosis.  6. The inferior vena cava is normal in size with <50% respiratory variability, suggesting right atrial pressure of 8 mmHg. Conclusion(s)/Recommendation(s): Findings consistent with Cor Pulmonale. Would consider acute PE if there are concerns for this clinically. FINDINGS  Left Ventricle: Left ventricular ejection fraction, by estimation, is 65 to 70%. The left ventricle has normal function. The left ventricle has no regional  wall motion abnormalities. The left ventricular internal cavity size was normal in size. There is  no left ventricular hypertrophy. The interventricular septum is flattened in systole and diastole, consistent with right ventricular pressure and volume overload. Left ventricular diastolic parameters are consistent with Grade I diastolic dysfunction (impaired relaxation). Right Ventricle: The right ventricular size is severely enlarged. No increase in right ventricular wall thickness. Right ventricular systolic function is moderately reduced. There is moderately elevated pulmonary artery  systolic pressure. The tricuspid regurgitant velocity is 3.45 m/s, and with an assumed right atrial pressure of 8 mmHg, the estimated right ventricular systolic pressure is 55.6 mmHg. Left Atrium: Left atrial size was normal in size. Right Atrium: Right atrial size was severely dilated. Pericardium: Trivial pericardial effusion is present. Mitral Valve: The mitral valve is grossly normal. No evidence of mitral valve regurgitation. No evidence of mitral valve stenosis. Tricuspid Valve: The tricuspid valve is grossly normal. Tricuspid valve regurgitation is mild . No evidence of tricuspid stenosis. Aortic Valve: The aortic valve is tricuspid. . There is mild thickening and mild calcification of the aortic valve. Aortic valve regurgitation is not visualized. Mild aortic valve sclerosis is present, with no evidence of aortic valve stenosis. There is mild thickening of the aortic valve. There is mild calcification of the aortic valve. Pulmonic Valve: The pulmonic valve was grossly normal. Pulmonic valve regurgitation is not visualized. No evidence of pulmonic stenosis. Aorta: The aortic root and ascending aorta are structurally normal, with no evidence of dilitation. Venous: The inferior vena cava is normal in size with less than 50% respiratory variability, suggesting right atrial pressure of 8 mmHg. IAS/Shunts: The atrial septum is  grossly normal. Additional Comments: There is a small pleural effusion in the left lateral region.  LEFT VENTRICLE PLAX 2D LVIDd:         2.90 cm     Diastology LVIDs:         1.90 cm     LV e' lateral:   4.57 cm/s LV PW:         0.80 cm     LV E/e' lateral: 16.4 LV IVS:        1.00 cm     LV e' medial:    8.49 cm/s LVOT diam:     1.60 cm     LV E/e' medial:  8.8 LV SV:         29 LV SV Index:   15 LVOT Area:     2.01 cm  LV Volumes (MOD) LV vol d, MOD A4C: 36.3 ml LV vol s, MOD A4C: 14.1 ml LV SV MOD A4C:     36.3 ml RIGHT VENTRICLE RV Basal diam:  6.00 cm RV Mid diam:    4.70 cm RV S prime:     10.60 cm/s TAPSE (M-mode): 2.1 cm LEFT ATRIUM           Index       RIGHT ATRIUM           Index LA diam:      3.10 cm 1.56 cm/m  RA Area:     15.80 cm LA Vol (A4C): 21.1 ml 10.60 ml/m RA Volume:   43.20 ml  21.70 ml/m  AORTIC VALVE LVOT Vmax:   85.50 cm/s LVOT Vmean:  57.800 cm/s LVOT VTI:    0.145 m  AORTA Ao Root diam: 2.20 cm Ao Asc diam:  2.40 cm MITRAL VALVE                TRICUSPID VALVE MV Area (PHT): 3.42 cm     TR Peak grad:   47.6 mmHg MV Decel Time: 222 msec     TR Vmax:        345.00 cm/s MV E velocity: 75.00 cm/s MV A velocity: 102.00 cm/s  SHUNTS MV E/A ratio:  0.74         Systemic VTI:  0.14 m  Systemic Diam: 1.60 cm Lennie Odor MD Electronically signed by Lennie Odor MD Signature Date/Time: 01/19/2020/2:26:01 PM    Final    VAS [place] LOWER EXTREMITY VENOUS (DVT)  Result Date: 01/21/2020  Lower Venous DVTStudy Indications: Edema.  Comparison Study: no prior Performing Technologist: Blanch Media RVS  Examination Guidelines: A complete evaluation includes B-mode imaging, spectral Doppler, color Doppler, and power Doppler as needed of all accessible portions of each vessel. Bilateral testing is considered an integral part of a complete examination. Limited examinations for reoccurring indications may be performed as noted. The reflux portion of the exam is performed with  the patient in reverse Trendelenburg.  +-----+---------------+---------+-----------+----------+--------------+ RIGHTCompressibilityPhasicitySpontaneityPropertiesThrombus Aging +-----+---------------+---------+-----------+----------+--------------+ CFV  Full           Yes      Yes                                 +-----+---------------+---------+-----------+----------+--------------+   +---------+---------------+---------+-----------+----------+--------------+ LEFT     CompressibilityPhasicitySpontaneityPropertiesThrombus Aging +---------+---------------+---------+-----------+----------+--------------+ CFV      Full           Yes      Yes                                 +---------+---------------+---------+-----------+----------+--------------+ SFJ      Full                                                        +---------+---------------+---------+-----------+----------+--------------+ FV Prox  Full                                                        +---------+---------------+---------+-----------+----------+--------------+ FV Mid   Full                                                        +---------+---------------+---------+-----------+----------+--------------+ FV DistalFull                                                        +---------+---------------+---------+-----------+----------+--------------+ PFV      Full                                                        +---------+---------------+---------+-----------+----------+--------------+ POP      Full           Yes      Yes                                 +---------+---------------+---------+-----------+----------+--------------+ PTV  Full                                                        +---------+---------------+---------+-----------+----------+--------------+ PERO     Full                                                         +---------+---------------+---------+-----------+----------+--------------+     Summary: RIGHT: - No evidence of common femoral vein obstruction.  LEFT: - There is no evidence of deep vein thrombosis in the lower extremity.  - No cystic structure found in the popliteal fossa.  *See table(s) above for measurements and observations. Electronically signed by Fabienne Bruns MD on 01/21/2020 at 10:09:22 AM.    Final         Scheduled Meds: . amLODipine  5 mg Oral Daily  . donepezil  5 mg Oral QHS  . DULoxetine  60 mg Oral QHS  . enoxaparin (LOVENOX) injection  40 mg Subcutaneous Q24H  . furosemide  40 mg Intravenous Q12H  . hydrALAZINE  50 mg Oral TID  . insulin aspart  0-15 Units Subcutaneous TID WC  . insulin aspart  0-5 Units Subcutaneous QHS  . insulin glargine  45 Units Subcutaneous QHS  . lisinopril  10 mg Oral QHS  . metoprolol tartrate  50 mg Oral BID  . montelukast  10 mg Oral QHS  . potassium chloride  10 mEq Oral Daily  . predniSONE  40 mg Oral Q breakfast  . pregabalin  100 mg Oral TID  . sodium chloride flush  3 mL Intravenous Q12H   Continuous Infusions: . sodium chloride       LOS: 2 days    Time spent: 30 minutes    Dorcas Carrow, MD [facility] Pager [phone]

## 2020-01-22 LAB — CBC WITH DIFFERENTIAL/PLATELET
Abs Immature Granulocytes: 0.05 10*3/uL (ref 0.00–0.07)
Basophils Absolute: 0 10*3/uL (ref 0.0–0.1)
Basophils Relative: 0 %
Eosinophils Absolute: 0 10*3/uL (ref 0.0–0.5)
Eosinophils Relative: 0 %
HCT: 37.6 % (ref 36.0–46.0)
Hemoglobin: 11.1 g/dL — ABNORMAL LOW (ref 12.0–15.0)
Immature Granulocytes: 0 %
Lymphocytes Relative: 11 %
Lymphs Abs: 1.3 10*3/uL (ref 0.7–4.0)
MCH: 21.6 pg — ABNORMAL LOW (ref 26.0–34.0)
MCHC: 29.5 g/dL — ABNORMAL LOW (ref 30.0–36.0)
MCV: 73.2 fL — ABNORMAL LOW (ref 80.0–100.0)
Monocytes Absolute: 0.9 10*3/uL (ref 0.1–1.0)
Monocytes Relative: 8 %
Neutro Abs: 9.3 10*3/uL — ABNORMAL HIGH (ref 1.7–7.7)
Neutrophils Relative %: 81 %
Platelets: 370 10*3/uL (ref 150–400)
RBC: 5.14 MIL/uL — ABNORMAL HIGH (ref 3.87–5.11)
RDW: 21.5 % — ABNORMAL HIGH (ref 11.5–15.5)
WBC: 11.6 10*3/uL — ABNORMAL HIGH (ref 4.0–10.5)
nRBC: 0.2 % (ref 0.0–0.2)

## 2020-01-22 LAB — GLUCOSE, CAPILLARY
Glucose-Capillary: 44 mg/dL — CL (ref 70–99)
Glucose-Capillary: 70 mg/dL (ref 70–99)
Glucose-Capillary: 96 mg/dL (ref 70–99)
Glucose-Capillary: 112 mg/dL — ABNORMAL HIGH (ref 70–99)
Glucose-Capillary: 246 mg/dL — ABNORMAL HIGH (ref 70–99)
Glucose-Capillary: 259 mg/dL — ABNORMAL HIGH (ref 70–99)

## 2020-01-22 MED ORDER — PREDNISONE 10 MG PO TABS: ORAL_TABLET | ORAL | 0 refills | Status: DC

## 2020-01-22 MED ORDER — INSULIN GLARGINE 100 UNIT/ML ~~LOC~~ SOLN
30.0000 [IU] | Freq: Every day | SUBCUTANEOUS | Status: DC
  Administered 2020-01-22: 30 [IU] via SUBCUTANEOUS
  Filled 2020-01-22 (×2): qty 0.3

## 2020-01-22 NOTE — Discharge Summary (Addendum)
Physician Discharge Summary  Kristen Dennis ZOX:[id number] DOB: [birth year] DOA: 01/18/2020  PCP: Claudean Severance, MD  Admit date: 01/18/2020 Discharge date: 01/23/2020  Admitted From: Home Disposition: Home with home health  Recommendations for Outpatient Follow-up:  1. Follow up with PCP in 1-2 weeks We will refer you to a lung doctor, office will call you for follow-up  Home Health: PT/OT Equipment/Devices: Oxygen available at home  Discharge Condition: Stable CODE STATUS: Full code Diet recommendation: Low-salt and low-carb diet  Discharge summary: 64 year old female with history of chronic diastolic congestive heart failure, known ejection fraction 65% 04/2019, COPD with chronic hypoxic respiratory failure on 2 to 3 L oxygen at home, insulin-dependent type 2 diabetes, hypertension, depression and vascular dementia presents to the emergency room for evaluation of shortness of breath.  Patient lives at home with her sister.  She was out with family for dinner, came back with shortness of breath. Her family called EMS as she was very short of breath.  Multiple rounds of treatments, duo nebs given in route and started on CPAP. In the emergency room placed on BiPAP.  Tachycardic.  WBC 23.7.  BNP 598.  Treated for multiple conditions as below.   1. Acute on chronic hypoxic respiratory failure: Multifactorial. Acute diastolic heart failure, pulmonary hypertension, cor pulmonale:  Treated with BiPAP.    Of BiPAP for 3 days.  Currently clinically improving.  Remains on 3 L oxygen today.  Good response to diuresis.   Due to significant right-sided findings on echocardiogram, a CTA of the chest was done and compared to CTA from last month.  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   Negative for PE.  Extremity duplex is negative for DVT.   Treated with IV Lasix.  Will change to oral today on discharge. Probably has underlying sarcoidosis,  will treat with trial of prednisone, prolonged taper and referral to pulmonary as outpatient for follow-up. Continue Singulair and as needed breathing treatments. Leukocytosis normalized.  No antibiotics were given.  2. Type 2 diabetes on insulin with hyperglycemia: Well controlled at home.  Remains on Lantus and sliding scale insulin. Holding Metformin. Hypoglycemic episode with increasing dose of Lantus last night.   She will go home today.  Will not use any insulin tonight.  She will resume her oral hypoglycemics. Her sister is going to monitor her blood sugars at home, if they are adequately above 200, she will start using her home dose of insulin tomorrow.  3.Hypertension: Blood pressures better on amlodipine, hydralazine, lisinopril low pressure.  4. Depression/dementia: Continue Cymbalta and donepezil.   Patient with significant deconditioning.  She would benefit with home health PT OT.  Discussed with her sister who is going to take her home and assist in all activities.  Addendum, 01/23/2020 80 a.m.: Patient could not go home yesterday evening as her family arrived in the hospital with no oxygen with them.  I have reviewed her discharge summary, recommendations and prescriptions.  No changes needed.  She can be going home today.  Discharge Diagnoses:  Principal Problem:   Acute on chronic diastolic CHF (congestive heart failure) (HCC) Active Problems:   Hypertension associated with diabetes (HCC)   Diabetes mellitus without complication (HCC)   COPD (chronic obstructive pulmonary disease) (HCC)   Dementia (HCC)   Leukocytosis   Respiratory failure, acute [facility])    Discharge Instructions  Discharge Instructions    Ambulatory referral to Pulmonology   Complete by: As directed  Reason for referral: Asthma/COPD   Diet - low sodium heart healthy   Complete by: As directed    Diet Carb Modified   Complete by: As directed    Discharge instructions   Complete by: As  directed    Do not use Lantus tonight.  Check blood sugars tomorrow, start using long-acting insulin only when blood sugars more than 200.   Increase activity slowly   Complete by: As directed      Allergies as of 01/23/2020   No Known Allergies     Medication List    TAKE these medications   albuterol 108 (90 Base) MCG/ACT inhaler Commonly known as: VENTOLIN HFA Inhale 2 puffs into the lungs every 6 (six) hours as needed for wheezing or shortness of breath.   amLODipine 5 MG tablet Commonly known as: NORVASC Take 5 mg by mouth daily.   Breo Ellipta 100-25 MCG/INH Aepb Generic drug: fluticasone furoate-vilanterol Inhale 1 puff into the lungs daily.   donepezil 5 MG tablet Commonly known as: ARICEPT Take 5 mg by mouth at bedtime.   DULoxetine 60 MG capsule Commonly known as: CYMBALTA Take 60 mg by mouth at bedtime.   ergocalciferol 1.25 MG (50000 UT) capsule Commonly known as: VITAMIN D2 Take 1 capsule by mouth once a week.   furosemide 40 MG tablet Commonly known as: LASIX Take 40 mg by mouth daily.   hydrALAZINE 50 MG tablet Commonly known as: APRESOLINE Take 50 mg by mouth 3 (three) times daily.   Januvia 25 MG tablet Generic drug: sitaGLIPtin Take 1 tablet by mouth daily.   Lantus SoloStar 100 UNIT/ML Solostar Pen Generic drug: insulin glargine Inject 50 Units into the skin at bedtime.   lisinopril 10 MG tablet Commonly known as: ZESTRIL Take 10 mg by mouth every evening.   metFORMIN 500 MG tablet Commonly known as: GLUCOPHAGE Take 1,000 mg by mouth 2 (two) times daily.   metoprolol tartrate 50 MG tablet Commonly known as: LOPRESSOR Take 50 mg by mouth 2 (two) times daily.   montelukast 10 MG tablet Commonly known as: SINGULAIR Take 10 mg by mouth at bedtime.   omeprazole 20 MG capsule Commonly known as: PRILOSEC Take 20 mg by mouth every evening.   potassium chloride SA 20 MEQ tablet Commonly known as: KLOR-CON Take 20 mEq by mouth  daily.   predniSONE 10 MG tablet Commonly known as: DELTASONE 4 tablet daily  for 3 days 3 tablet daily for 3 days 2 tablet daily for 3 days 1 tablet daily for 3 days   pregabalin 100 MG capsule Commonly known as: Lyrica Take 1 capsule (100 mg total) by mouth 3 (three) times daily.       Follow-up Information    [facility], [facility] Follow up.   Specialty: Home Health Services Why: [place], HHPT,HHOT Contact information: [address] [place] [address] [phone]              No Known Allergies  Procedures/Studies: CT ANGIO CHEST PE W OR WO CONTRAST  Result Date: 01/20/2020 CLINICAL DATA:  Chest pain and shortness of breath. EXAM: CT ANGIOGRAPHY CHEST WITH CONTRAST TECHNIQUE: Multidetector CT imaging of the chest was performed using the standard protocol during bolus administration of intravenous contrast. Multiplanar CT image reconstructions and MIPs were obtained to evaluate the vascular anatomy. CONTRAST:  25mL OMNIPAQUE IOHEXOL 350 MG/ML SOLN COMPARISON:  08/02/2018 FINDINGS: Cardiovascular: The heart is upper limits of normal in size and stable. No pericardial effusion.  The aorta is normal in caliber. Moderate atherosclerotic calcifications but no dissection. 2 vessel coronary artery calcifications are noted. Reflux of contrast down the IVC and into the hepatic veins suggesting right heart failure or tricuspid regurgitation. The pulmonary arterial tree is well opacified. No filling defects to suggest pulmonary embolism. Mediastinum/Nodes: Persistent bulky mediastinal and hilar lymphadenopathy. Recommend clinical correlation. Suspect process such as sarcoidosis. The esophagus is grossly normal. Lungs/Pleura: Stable significant chronic lung disease with emphysema and pulmonary scarring. There is also diffuse hazy ground-glass opacity in the lungs suggesting pulmonary edema or other partial airspace filling process such as inflammatory infectious alveolitis or  hypersensitivity pneumonitis. Small to moderate-sized left pleural effusion is noted with overlying atelectasis. There is also streaky right basilar subsegmental atelectasis. Upper Abdomen: No significant upper abdominal findings. The gallbladder is surgically absent. Musculoskeletal: No significant bony findings. Review of the MIP images confirms the above findings. IMPRESSION: 1. No CT findings for pulmonary embolism. 2. No thoracic aortic aneurysm or dissection. 3. Aortic and coronary artery calcifications. 4. Persistent bulky mediastinal and hilar lymphadenopathy. Suspect process such as sarcoidosis. 5. Stable significant chronic lung disease with emphysema and pulmonary scarring. 6. Diffuse hazy ground-glass opacity in the lungs suggesting pulmonary edema or other partial airspace filling process such as inflammatory infectious alveolitis or hypersensitivity pneumonitis. 7. Small to moderate-sized left pleural effusion with overlying atelectasis. 8. Reflux of contrast down the IVC and into the hepatic veins suggesting right heart failure or tricuspid regurgitation. 9. Emphysema and aortic atherosclerosis. Aortic Atherosclerosis (ICD10-I70.0) and Emphysema (ICD10-J43.9). Aortic Atherosclerosis (ICD10-I70.0) and Emphysema (ICD10-J43.9). Electronically Signed   By: Rudie Meyer M.D.   On: 01/20/2020 09:37   DG Chest Portable 1 View  Result Date: 01/18/2020 CLINICAL DATA:  Worsening shortness of breath EXAM: PORTABLE CHEST 1 VIEW COMPARISON:  08/02/2018 FINDINGS: Single frontal view of the chest demonstrates a stable cardiac silhouette. Diffuse interstitial prominence is seen throughout the lungs, with basilar predominant ground-glass airspace disease. No effusions or pneumothorax. IMPRESSION: 1. Diffuse interstitial and ground-glass opacities, greatest at the bases. Favor pulmonary edema over multifocal infection. Electronically Signed   By: Sharlet Salina M.D.   On: 01/18/2020 21:16   ECHOCARDIOGRAM  COMPLETE  Result Date: 01/19/2020    ECHOCARDIOGRAM REPORT   Patient Name:   LIZA Wandler Date of Exam: 01/19/2020 Medical Rec #:  [id number]   Height:       66.0 in Accession #:    [id number]  Weight:       198.0 lb Date of Birth:  17-Jun-1956   BSA:          1.991 m Patient Age:    64 years    BP:           128/75 mmHg Patient Gender: F           HR:           92 bpm. Exam Location:  Inpatient Procedure: 2D Echo, Cardiac Doppler and Color Doppler Indications:    I50.9* Heart failure (unspecified)  History:        Patient has no prior history of Echocardiogram examinations.  Sonographer:    Roosvelt Maser RDCS Referring Phys: [id number] VISHAL R PATEL IMPRESSIONS  1. Left ventricular ejection fraction, by estimation, is 65 to 70%. The left ventricle has normal function. The left ventricle has no regional wall motion abnormalities. Left ventricular diastolic parameters are consistent with Grade I diastolic dysfunction (impaired relaxation). There is the interventricular septum is flattened in systole and diastole,  consistent with right ventricular pressure and volume overload.  2. Right ventricular systolic function is moderately reduced. The right ventricular size is severely enlarged. There is moderately elevated pulmonary artery systolic pressure. The estimated right ventricular systolic pressure is 55.6 mmHg.  3. Right atrial size was severely dilated.  4. The mitral valve is grossly normal. No evidence of mitral valve regurgitation. No evidence of mitral stenosis.  5. The aortic valve is tricuspid. Aortic valve regurgitation is not visualized. Mild aortic valve sclerosis is present, with no evidence of aortic valve stenosis.  6. The inferior vena cava is normal in size with <50% respiratory variability, suggesting right atrial pressure of 8 mmHg. Conclusion(s)/Recommendation(s): Findings consistent with Cor Pulmonale. Would consider acute PE if there are concerns for this clinically. FINDINGS  Left Ventricle: Left  ventricular ejection fraction, by estimation, is 65 to 70%. The left ventricle has normal function. The left ventricle has no regional wall motion abnormalities. The left ventricular internal cavity size was normal in size. There is  no left ventricular hypertrophy. The interventricular septum is flattened in systole and diastole, consistent with right ventricular pressure and volume overload. Left ventricular diastolic parameters are consistent with Grade I diastolic dysfunction (impaired relaxation). Right Ventricle: The right ventricular size is severely enlarged. No increase in right ventricular wall thickness. Right ventricular systolic function is moderately reduced. There is moderately elevated pulmonary artery systolic pressure. The tricuspid regurgitant velocity is 3.45 m/s, and with an assumed right atrial pressure of 8 mmHg, the estimated right ventricular systolic pressure is 55.6 mmHg. Left Atrium: Left atrial size was normal in size. Right Atrium: Right atrial size was severely dilated. Pericardium: Trivial pericardial effusion is present. Mitral Valve: The mitral valve is grossly normal. No evidence of mitral valve regurgitation. No evidence of mitral valve stenosis. Tricuspid Valve: The tricuspid valve is grossly normal. Tricuspid valve regurgitation is mild . No evidence of tricuspid stenosis. Aortic Valve: The aortic valve is tricuspid. . There is mild thickening and mild calcification of the aortic valve. Aortic valve regurgitation is not visualized. Mild aortic valve sclerosis is present, with no evidence of aortic valve stenosis. There is mild thickening of the aortic valve. There is mild calcification of the aortic valve. Pulmonic Valve: The pulmonic valve was grossly normal. Pulmonic valve regurgitation is not visualized. No evidence of pulmonic stenosis. Aorta: The aortic root and ascending aorta are structurally normal, with no evidence of dilitation. Venous: The inferior vena cava is normal  in size with less than 50% respiratory variability, suggesting right atrial pressure of 8 mmHg. IAS/Shunts: The atrial septum is grossly normal. Additional Comments: There is a small pleural effusion in the left lateral region.  LEFT VENTRICLE PLAX 2D LVIDd:         2.90 cm     Diastology LVIDs:         1.90 cm     LV e' lateral:   4.57 cm/s LV PW:         0.80 cm     LV E/e' lateral: 16.4 LV IVS:        1.00 cm     LV e' medial:    8.49 cm/s LVOT diam:     1.60 cm     LV E/e' medial:  8.8 LV SV:         29 LV SV Index:   15 LVOT Area:     2.01 cm  LV Volumes (MOD) LV vol d, MOD A4C: 36.3 ml LV vol  s, MOD A4C: 14.1 ml LV SV MOD A4C:     36.3 ml RIGHT VENTRICLE RV Basal diam:  6.00 cm RV Mid diam:    4.70 cm RV S prime:     10.60 cm/s TAPSE (M-mode): 2.1 cm LEFT ATRIUM           Index       RIGHT ATRIUM           Index LA diam:      3.10 cm 1.56 cm/m  RA Area:     15.80 cm LA Vol (A4C): 21.1 ml 10.60 ml/m RA Volume:   43.20 ml  21.70 ml/m  AORTIC VALVE LVOT Vmax:   85.50 cm/s LVOT Vmean:  57.800 cm/s LVOT VTI:    0.145 m  AORTA Ao Root diam: 2.20 cm Ao Asc diam:  2.40 cm MITRAL VALVE                TRICUSPID VALVE MV Area (PHT): 3.42 cm     TR Peak grad:   47.6 mmHg MV Decel Time: 222 msec     TR Vmax:        345.00 cm/s MV E velocity: 75.00 cm/s MV A velocity: 102.00 cm/s  SHUNTS MV E/A ratio:  0.74         Systemic VTI:  0.14 m                             Systemic Diam: 1.60 cm Lennie Odor MD Electronically signed by Lennie Odor MD Signature Date/Time: 01/19/2020/2:26:01 PM    Final    VAS [place] LOWER EXTREMITY VENOUS (DVT)  Result Date: 01/21/2020  Lower Venous DVTStudy Indications: Edema.  Comparison Study: no prior Performing Technologist: Blanch Media RVS  Examination Guidelines: A complete evaluation includes B-mode imaging, spectral Doppler, color Doppler, and power Doppler as needed of all accessible portions of each vessel. Bilateral testing is considered an integral part of a complete  examination. Limited examinations for reoccurring indications may be performed as noted. The reflux portion of the exam is performed with the patient in reverse Trendelenburg.  +-----+---------------+---------+-----------+----------+--------------+ RIGHTCompressibilityPhasicitySpontaneityPropertiesThrombus Aging +-----+---------------+---------+-----------+----------+--------------+ CFV  Full           Yes      Yes                                 +-----+---------------+---------+-----------+----------+--------------+   +---------+---------------+---------+-----------+----------+--------------+ LEFT     CompressibilityPhasicitySpontaneityPropertiesThrombus Aging +---------+---------------+---------+-----------+----------+--------------+ CFV      Full           Yes      Yes                                 +---------+---------------+---------+-----------+----------+--------------+ SFJ      Full                                                        +---------+---------------+---------+-----------+----------+--------------+ FV Prox  Full                                                        +---------+---------------+---------+-----------+----------+--------------+  FV Mid   Full                                                        +---------+---------------+---------+-----------+----------+--------------+ FV DistalFull                                                        +---------+---------------+---------+-----------+----------+--------------+ PFV      Full                                                        +---------+---------------+---------+-----------+----------+--------------+ POP      Full           Yes      Yes                                 +---------+---------------+---------+-----------+----------+--------------+ PTV      Full                                                         +---------+---------------+---------+-----------+----------+--------------+ PERO     Full                                                        +---------+---------------+---------+-----------+----------+--------------+     Summary: RIGHT: - No evidence of common femoral vein obstruction.  LEFT: - There is no evidence of deep vein thrombosis in the lower extremity.  - No cystic structure found in the popliteal fossa.  *See table(s) above for measurements and observations. Electronically signed by Fabienne Bruns MD on 01/21/2020 at 10:09:22 AM.    Final    (Echo, Carotid, EGD, Colonoscopy, ERCP)    Subjective: Patient seen and examined.  Herself denies any complaints.  On 3 L oxygen, ambulated with physical therapy and remains on 3 L oxygen.  Has some congestion but denies any wheezing or shortness of breath. She had some hypoglycemic episodes in the morning they were asymptomatic.  Currently corrected.   Discharge Exam: Vitals:   01/23/20 0347 01/23/20 0800  BP: 135/82 138/74  Pulse:  61  Resp: 14 17  Temp: 97.7 F (36.5 C) 97.9 F (36.6 C)  SpO2:  92%   Vitals:   01/22/20 2137 01/23/20 0015 01/23/20 0347 01/23/20 0800  BP: (!) 118/50 115/76 135/82 138/74  Pulse:  (!) 59  61  Resp:  17 14 17   Temp:  97.6 F (36.4 C) 97.7 F (36.5 C) 97.9 F (36.6 C)  TempSrc:  Oral Oral Oral  SpO2:  100%  92%  Weight:   85.2 kg   Height:  General: Pt is alert, awake, not in acute distress Chronically sick looking but not in any distress.  On 3 L oxygen. Cardiovascular: RRR, S1/S2 +, no rubs, no gallops Respiratory: CTA bilaterally, no wheezing, no rhonchi, no added sounds. Abdominal: Soft, NT, ND, bowel sounds + Extremities: no edema, no cyanosis    The results of significant diagnostics from this hospitalization (including imaging, microbiology, ancillary and laboratory) are listed below for reference.     Microbiology: Recent Results (from the past 240 hour(s))  SARS  Coronavirus 2 by RT PCR (hospital order, performed in [facility] hospital lab) Nasopharyngeal Nasopharyngeal Swab     Status: None   Collection Time: 01/18/20  9:20 PM   Specimen: Nasopharyngeal Swab  Result Value Ref Range Status   SARS Coronavirus 2 NEGATIVE NEGATIVE Final    Comment: (NOTE) SARS-CoV-2 target nucleic acids are NOT DETECTED.  The SARS-CoV-2 RNA is generally detectable in upper and lower respiratory specimens during the acute phase of infection. The lowest concentration of SARS-CoV-2 viral copies this assay can detect is 250 copies / mL. A negative result does not preclude SARS-CoV-2 infection and should not be used as the sole basis for treatment or other patient management decisions.  A negative result may occur with improper specimen collection / handling, submission of specimen other than nasopharyngeal swab, presence of viral mutation(s) within the areas targeted by this assay, and inadequate number of viral copies (<250 copies / mL). A negative result must be combined with clinical observations, patient history, and epidemiological information.  Fact Sheet for Patients:   BoilerBrush.com.cyhttps://www.fda.gov/media/136312/download  Fact Sheet for Healthcare Providers: https://pope.com/https://www.fda.gov/media/136313/download  This test is not yet approved or  cleared by the [place] FDA and has been authorized for detection and/or diagnosis of SARS-CoV-2 by FDA under an Emergency Use Authorization (EUA).  This EUA will remain in effect (meaning this test can be used) for the duration of the COVID-19 declaration under Section 564(b)(1) of the Act, 21 U.S.C. section 360bbb-3(b)(1), unless the authorization is terminated or revoked sooner.  Performed at [facility], [address] [place], [place] [address]   MRSA PCR Screening     Status: None   Collection Time: 01/19/20  1:20 AM   Specimen: Nasopharyngeal  Result Value Ref Range Status   MRSA by PCR NEGATIVE NEGATIVE Final     Comment:        The GeneXpert MRSA Assay (FDA approved for NASAL specimens only), is one component of a comprehensive MRSA colonization surveillance program. It is not intended to diagnose MRSA infection nor to guide or monitor treatment for MRSA infections. Performed at [facility], [address] [place], [place] [address]      Labs: BNP (last 3 results) Recent Labs    01/18/20 2120  BNP 598.0*   Basic Metabolic Panel: Recent Labs  Lab 01/18/20 2059 01/18/20 2103 01/19/20 0225 01/21/20 0300  NA 138 139 136 139  K 4.4 4.3 4.7 4.9  CL 105  --  103 103  CO2 19*  --  18* 24  GLUCOSE 236*  --  329* 178*  BUN 12  --  14 17  CREATININE 1.21*  --  1.21* 0.90  CALCIUM 8.4*  --  8.4* 9.0  MG  --   --  1.7 1.9  PHOS  --   --   --  4.1   Liver Function Tests: Recent Labs  Lab 01/18/20 2059  AST 29  ALT 19  ALKPHOS  106  BILITOT 0.8  PROT 6.8  ALBUMIN 2.4*   No results for input(s): LIPASE, AMYLASE in the last 168 hours. No results for input(s): AMMONIA in the last 168 hours. CBC: Recent Labs  Lab 01/18/20 2059 01/18/20 2059 01/18/20 2103 01/19/20 0225 01/21/20 0300 01/22/20 0212 01/23/20 0337  WBC 23.7*  --   --  19.4* 13.3* 11.6* 11.2*  NEUTROABS 19.6*  --   --   --  12.0* 9.3* 8.2*  HGB 12.3   < > 14.6 12.2 10.9* 11.1* 11.9*  HCT 41.8   < > 43.0 40.6 37.0 37.6 40.0  MCV 75.3*  --   --  73.6* 74.3* 73.2* 73.8*  PLT 512*  --   --  433* 354 370 395   < > = values in this interval not displayed.   Cardiac Enzymes: No results for input(s): CKTOTAL, CKMB, CKMBINDEX, TROPONINI in the last 168 hours. BNP: Invalid input(s): POCBNP CBG: Recent Labs  Lab 01/22/20 0638 01/22/20 1127 01/22/20 1645 01/22/20 2125 01/23/20 0639  GLUCAP 96 112* 246* 259* 77   D-Dimer No results for input(s): DDIMER in the last 72 hours. Hgb A1c No results for input(s): HGBA1C in the last 72 hours. Lipid Profile No results for input(s): CHOL, HDL, LDLCALC,  TRIG, CHOLHDL, LDLDIRECT in the last 72 hours. Thyroid function studies No results for input(s): TSH, T4TOTAL, T3FREE, THYROIDAB in the last 72 hours.  Invalid input(s): FREET3 Anemia work up No results for input(s): VITAMINB12, FOLATE, FERRITIN, TIBC, IRON, RETICCTPCT in the last 72 hours. Urinalysis No results found for: COLORURINE, APPEARANCEUR, LABSPEC, PHURINE, GLUCOSEU, HGBUR, BILIRUBINUR, KETONESUR, PROTEINUR, UROBILINOGEN, NITRITE, LEUKOCYTESUR Sepsis Labs Invalid input(s): PROCALCITONIN,  WBC,  LACTICIDVEN Microbiology Recent Results (from the past 240 hour(s))  SARS Coronavirus 2 by RT PCR (hospital order, performed in [facility] hospital lab) Nasopharyngeal Nasopharyngeal Swab     Status: None   Collection Time: 01/18/20  9:20 PM   Specimen: Nasopharyngeal Swab  Result Value Ref Range Status   SARS Coronavirus 2 NEGATIVE NEGATIVE Final    Comment: (NOTE) SARS-CoV-2 target nucleic acids are NOT DETECTED.  The SARS-CoV-2 RNA is generally detectable in upper and lower respiratory specimens during the acute phase of infection. The lowest concentration of SARS-CoV-2 viral copies this assay can detect is 250 copies / mL. A negative result does not preclude SARS-CoV-2 infection and should not be used as the sole basis for treatment or other patient management decisions.  A negative result may occur with improper specimen collection / handling, submission of specimen other than nasopharyngeal swab, presence of viral mutation(s) within the areas targeted by this assay, and inadequate number of viral copies (<250 copies / mL). A negative result must be combined with clinical observations, patient history, and epidemiological information.  Fact Sheet for Patients:   BoilerBrush.com.cy  Fact Sheet for Healthcare Providers: https://pope.com/  This test is not yet approved or  cleared by the [place] FDA and has been authorized  for detection and/or diagnosis of SARS-CoV-2 by FDA under an Emergency Use Authorization (EUA).  This EUA will remain in effect (meaning this test can be used) for the duration of the COVID-19 declaration under Section 564(b)(1) of the Act, 21 U.S.C. section 360bbb-3(b)(1), unless the authorization is terminated or revoked sooner.  Performed at [facility], [address] [place], [place] [address]   MRSA PCR Screening     Status: None   Collection Time: 01/19/20  1:20 AM   Specimen: Nasopharyngeal  Result Value Ref Range Status   MRSA by PCR NEGATIVE NEGATIVE Final    Comment:        The GeneXpert MRSA Assay (FDA approved for NASAL specimens only), is one component of a comprehensive MRSA colonization surveillance program. It is not intended to diagnose MRSA infection nor to guide or monitor treatment for MRSA infections. Performed at [facility], [address], [place], [place] [address]      Time coordinating discharge:  40 minutes  SIGNED:   Dorcas Carrow, MD  [facility] 01/23/2020, 8:22 AM

## 2020-01-22 NOTE — Progress Notes (Signed)
Hypoglycemic Event  CBG: 44  Treatment: 8 oz orange juice  Symptoms: Asymptomatic. Patient sleeping  Follow-up CBG: Time:0615 CBG Result:70   Possible Reasons for Event:High unit of Lantus @ night  Comments/MD notified: patient asymptomatic. Informed the charged nurse. Patient given 8 oz orange juice @0615  again. Patient's CBG @0630 ;  96    Kristen Dennis

## 2020-01-22 NOTE — TOC Transition Note (Addendum)
Transition of Care [facility]) - CM/SW Discharge Note   Patient Details  Name: Kristen Dennis MRN: [id number] Date of Birth: 1956-02-07  Transition of Care [facility]) CM/SW Contact:  Leone Haven, RN Phone Number: 01/22/2020, 10:09 AM   Clinical Narrative:    Patient lives with her sister, she has a walker at home and she has home oxygen 2liters,  She states she has the concentrator also.  Patient told NCM to call her sister regarding set up for [facility] services.  NCM called Ernestine and offered choice, she chose [facility], referral given to [facility] and he is able to take referral.  Soc will begin 24 to 48 hrs post dc.    Final next level of care: Home w Home Health Services Barriers to Discharge: Continued Medical Work up   Patient Goals and CMS Choice Patient states their goals for this hospitalization and ongoing recovery are:: to get better CMS Medicare.gov Compare Post Acute Care list provided to:: Patient Represenative (must comment) Choice offered to / list presented to : Sibling  Discharge Placement                       Discharge Plan and Services                  DME Agency: NA       HH Arranged: RN, Disease Management, PT, OT HH Agency: [facility] Date [facility] Agency Contacted: 01/22/20 Time HH Agency Contacted: [phone] Representative spoke with at [facility] Agency: Kandee Keen  Social Determinants of Health (SDOH) Interventions     Readmission Risk Interventions No flowsheet data found.

## 2020-01-22 NOTE — Progress Notes (Signed)
Patient's sister Veva Holes came to pick up patient for discharge but did not bring patients home O2. I made her aware that discharge orders were in and that she could go home and bring patients home O2 back. Patients sister stated that "if I leave i'm not coming back, I live in [place]". MD was paged and informed that patients sister was not able to take her home today. MD aware and patient is to discharge in the A.M.

## 2020-01-22 NOTE — Progress Notes (Signed)
PROGRESS NOTE    Trula SladeGracie Sarvis  ZOX:[id number] DOB: 16-Feb-1956 DOA: 01/18/2020 PCP: Claudean SeveranceBradley, Betty, MD    Brief Narrative:  64 year old female with history of chronic diastolic congestive heart failure, known ejection fraction 65% 04/2019, COPD with chronic hypoxic respiratory failure on 2 to 3 L oxygen at home, insulin-dependent type 2 diabetes, hypertension, depression and reported dementia presents to the emergency room for evaluation of shortness of breath.  Apparently, she was visiting her sister in [place].  Her family called EMS as she was very short of breath.  Multiple rounds of treatments, duo nebs given in route and started on CPAP. In the emergency room placed on BiPAP.  Tachycardic.  WBC 23.7.  BNP 598. 7/14, oxygenation improving.  She is now on 4 L oxygen.  Had hypoglycemic episodes.   Assessment & Plan:   Principal Problem:   Acute on chronic diastolic CHF (congestive heart failure) (HCC) Active Problems:   Hypertension associated with diabetes (HCC)   Diabetes mellitus without complication (HCC)   COPD (chronic obstructive pulmonary disease) (HCC)   Dementia (HCC)   Leukocytosis   Respiratory failure, acute (HCC)  Acute on chronic hypoxic respiratory failure: Multifactorial. Acute diastolic heart failure, pulmonary hypertension, cor pulmonale:  Treated with BiPAP.  Currently clinically improving.  Remains on 4 L oxygen today.  Good response to diuresis.   Continue BiPAP if needed.   Due to significant right-sided findings on echocardiogram, a CTA of the chest was done and compared to CTA from last month.  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   Negative for PE.  Extremity duplex is negative for DVT.   Treated with IV Lasix.  Will change to oral today. Probably has underlying sarcoidosis, will treat with trial of prednisone, prolonged taper and referral to pulmonary as outpatient for follow-up. Continue  Singulair and as needed breathing treatments. No antibiotics, leukocytosis is likely due to stress reaction, will monitor.  Normalizing. Start doing chest physiotherapy today. Ambulate with PT OT today.  Type 2 diabetes on insulin with hyperglycemia: Well controlled at home.  Remains on Lantus and sliding scale insulin. Holding Metformin. Hypoglycemic episode with increasing dose of Lantus last night.  Decrease Lantus to 30 units from 45 units.  Monitor.  Hypertension: Blood pressures better on amlodipine, hydralazine, lisinopril low pressure.  Depression/dementia: Continue Cymbalta and donepezil.   DVT prophylaxis: enoxaparin (LOVENOX) injection 40 mg Start: 01/19/20 1000   Code Status: Full code Family Communication: Sister on the phone. disposition Plan: Status is: Inpatient  The patient will require care spanning > 2 midnights and should be moved to inpatient because: Altered mental status, IV treatments appropriate due to intensity of illness or inability to take PO and Inpatient level of care appropriate due to severity of illness  Dispo: The patient is from: [place]              Anticipated d/c is to: Home with home health services.              Anticipated d/c date is: Tomorrow anticipated.              Patient currently is not medically stable to d/c.  Still on 4 L oxygen at rest.  Will mobilize.  Consult PT OT.     Consultants:   None  Procedures:   None  Antimicrobials:   None   Subjective: Patient seen and examined.  No overnight events.  Remains on 4 to 5 L of  oxygen.  Still has some congestion.  She wants to walk.  She looks forward to go home with her sister tomorrow.  Objective: Vitals:   01/21/20 2343 01/22/20 0351 01/22/20 0448 01/22/20 0731  BP: 122/68 (!) 141/77  (!) 112/92  Pulse: (!) 57 (!) 55  69  Resp: 15 13  14   Temp: 97.6 F (36.4 C) 97.6 F (36.4 C)  98.9 F (37.2 C)  TempSrc: Oral Oral  Oral  SpO2: 95% 95%  99%  Weight:   85.2 kg     Height:        Intake/Output Summary (Last 24 hours) at 01/22/2020 1045 Last data filed at 01/22/2020 1029 Gross per 24 hour  Intake 206 ml  Output 2900 ml  Net -2694 ml   Filed Weights   01/20/20 0412 01/21/20 0536 01/22/20 0448  Weight: 88.2 kg 87.5 kg 85.2 kg    Examination:  General exam: Appears chronically sick, currently on 4 L nasal cannula oxygen.  Looks comfortable. Respiratory system: No wheezing or crepitus.  Some conducted airway sounds. Cardiovascular system: S1 & S2 heard, RRR.  No edema. Gastrointestinal system: Abdomen is nondistended, soft and nontender. No organomegaly or masses felt. Normal bowel sounds heard. Central nervous system: Patient is alert and awake.  She is oriented x 2-3.  She has cognitive dysfunction from previous stroke.   Extremities: Symmetric 5 x 5 power.  Moves all extremities.    Data Reviewed: I have personally reviewed following labs and imaging studies  CBC: Recent Labs  Lab 01/18/20 2059 01/18/20 2103 01/19/20 0225 01/21/20 0300 01/22/20 0212  WBC 23.7*  --  19.4* 13.3* 11.6*  NEUTROABS 19.6*  --   --  12.0* 9.3*  HGB 12.3 14.6 12.2 10.9* 11.1*  HCT 41.8 43.0 40.6 37.0 37.6  MCV 75.3*  --  73.6* 74.3* 73.2*  PLT 512*  --  433* 354 370   Basic Metabolic Panel: Recent Labs  Lab 01/18/20 2059 01/18/20 2103 01/19/20 0225 01/21/20 0300  NA 138 139 136 139  K 4.4 4.3 4.7 4.9  CL 105  --  103 103  CO2 19*  --  18* 24  GLUCOSE 236*  --  329* 178*  BUN 12  --  14 17  CREATININE 1.21*  --  1.21* 0.90  CALCIUM 8.4*  --  8.4* 9.0  MG  --   --  1.7 1.9  PHOS  --   --   --  4.1   GFR: Estimated Creatinine Clearance: 68.6 mL/min (by C-G formula based on SCr of 0.9 mg/dL). Liver Function Tests: Recent Labs  Lab 01/18/20 2059  AST 29  ALT 19  ALKPHOS 106  BILITOT 0.8  PROT 6.8  ALBUMIN 2.4*   No results for input(s): LIPASE, AMYLASE in the last 168 hours. No results for input(s): AMMONIA in the last 168  hours. Coagulation Profile: No results for input(s): INR, PROTIME in the last 168 hours. Cardiac Enzymes: No results for input(s): CKTOTAL, CKMB, CKMBINDEX, TROPONINI in the last 168 hours. BNP (last 3 results) No results for input(s): PROBNP in the last 8760 hours. HbA1C: No results for input(s): HGBA1C in the last 72 hours. CBG: Recent Labs  Lab 01/21/20 1618 01/21/20 2118 01/22/20 0551 01/22/20 0612 01/22/20 0638  GLUCAP 276* 211* 44* 70 96   Lipid Profile: No results for input(s): CHOL, HDL, LDLCALC, TRIG, CHOLHDL, LDLDIRECT in the last 72 hours. Thyroid Function Tests: No results for input(s): TSH, T4TOTAL, FREET4, T3FREE, THYROIDAB in  the last 72 hours. Anemia Panel: No results for input(s): VITAMINB12, FOLATE, FERRITIN, TIBC, IRON, RETICCTPCT in the last 72 hours. Sepsis Labs: No results for input(s): PROCALCITON, LATICACIDVEN in the last 168 hours.  Recent Results (from the past 240 hour(s))  SARS Coronavirus 2 by RT PCR (hospital order, performed in [facility] hospital lab) Nasopharyngeal Nasopharyngeal Swab     Status: None   Collection Time: 01/18/20  9:20 PM   Specimen: Nasopharyngeal Swab  Result Value Ref Range Status   SARS Coronavirus 2 NEGATIVE NEGATIVE Final    Comment: (NOTE) SARS-CoV-2 target nucleic acids are NOT DETECTED.  The SARS-CoV-2 RNA is generally detectable in upper and lower respiratory specimens during the acute phase of infection. The lowest concentration of SARS-CoV-2 viral copies this assay can detect is 250 copies / mL. A negative result does not preclude SARS-CoV-2 infection and should not be used as the sole basis for treatment or other patient management decisions.  A negative result may occur with improper specimen collection / handling, submission of specimen other than nasopharyngeal swab, presence of viral mutation(s) within the areas targeted by this assay, and inadequate number of viral copies (<250 copies / mL). A negative  result must be combined with clinical observations, patient history, and epidemiological information.  Fact Sheet for Patients:   BoilerBrush.com.cy  Fact Sheet for Healthcare Providers: https://pope.com/  This test is not yet approved or  cleared by the [place] FDA and has been authorized for detection and/or diagnosis of SARS-CoV-2 by FDA under an Emergency Use Authorization (EUA).  This EUA will remain in effect (meaning this test can be used) for the duration of the COVID-19 declaration under Section 564(b)(1) of the Act, 21 U.S.C. section 360bbb-3(b)(1), unless the authorization is terminated or revoked sooner.  Performed at [facility], [address] [place], [place] [address]   MRSA PCR Screening     Status: None   Collection Time: 01/19/20  1:20 AM   Specimen: Nasopharyngeal  Result Value Ref Range Status   MRSA by PCR NEGATIVE NEGATIVE Final    Comment:        The GeneXpert MRSA Assay (FDA approved for NASAL specimens only), is one component of a comprehensive MRSA colonization surveillance program. It is not intended to diagnose MRSA infection nor to guide or monitor treatment for MRSA infections. Performed at [facility], [address], [place], [place] [address]          Radiology Studies: VAS [place] LOWER EXTREMITY VENOUS (DVT)  Result Date: 01/21/2020  Lower Venous DVTStudy Indications: Edema.  Comparison Study: no prior Performing Technologist: Blanch Media RVS  Examination Guidelines: A complete evaluation includes B-mode imaging, spectral Doppler, color Doppler, and power Doppler as needed of all accessible portions of each vessel. Bilateral testing is considered an integral part of a complete examination. Limited examinations for reoccurring indications may be performed as noted. The reflux portion of the exam is performed with the patient in reverse Trendelenburg.   +-----+---------------+---------+-----------+----------+--------------+ RIGHTCompressibilityPhasicitySpontaneityPropertiesThrombus Aging +-----+---------------+---------+-----------+----------+--------------+ CFV  Full           Yes      Yes                                 +-----+---------------+---------+-----------+----------+--------------+   +---------+---------------+---------+-----------+----------+--------------+ LEFT     CompressibilityPhasicitySpontaneityPropertiesThrombus Aging +---------+---------------+---------+-----------+----------+--------------+ CFV      Full  Yes      Yes                                 +---------+---------------+---------+-----------+----------+--------------+ SFJ      Full                                                        +---------+---------------+---------+-----------+----------+--------------+ FV Prox  Full                                                        +---------+---------------+---------+-----------+----------+--------------+ FV Mid   Full                                                        +---------+---------------+---------+-----------+----------+--------------+ FV DistalFull                                                        +---------+---------------+---------+-----------+----------+--------------+ PFV      Full                                                        +---------+---------------+---------+-----------+----------+--------------+ POP      Full           Yes      Yes                                 +---------+---------------+---------+-----------+----------+--------------+ PTV      Full                                                        +---------+---------------+---------+-----------+----------+--------------+ PERO     Full                                                         +---------+---------------+---------+-----------+----------+--------------+     Summary: RIGHT: - No evidence of common femoral vein obstruction.  LEFT: - There is no evidence of deep vein thrombosis in the lower extremity.  - No cystic structure found in the popliteal fossa.  *See table(s) above for measurements and observations. Electronically signed by Fabienne Bruns MD on 01/21/2020 at 10:09:22 AM.    Final         Scheduled Meds: .  amLODipine  5 mg Oral Daily  . donepezil  5 mg Oral QHS  . DULoxetine  60 mg Oral QHS  . enoxaparin (LOVENOX) injection  40 mg Subcutaneous Q24H  . furosemide  40 mg Intravenous Q12H  . hydrALAZINE  50 mg Oral TID  . insulin aspart  0-15 Units Subcutaneous TID WC  . insulin aspart  0-5 Units Subcutaneous QHS  . insulin glargine  30 Units Subcutaneous QHS  . lisinopril  10 mg Oral QHS  . metoprolol tartrate  50 mg Oral BID  . montelukast  10 mg Oral QHS  . potassium chloride  10 mEq Oral Daily  . predniSONE  40 mg Oral Q breakfast  . pregabalin  100 mg Oral TID  . sodium chloride flush  3 mL Intravenous Q12H   Continuous Infusions: . sodium chloride       LOS: 3 days    Time spent: 25 minutes    Dorcas Carrow, MD [facility] Pager [phone]

## 2020-01-22 NOTE — Evaluation (Signed)
Physical Therapy Evaluation Patient Details Name: Kristen Dennis MRN: [id number] DOB: 11/22/1955 Today's Date: 01/22/2020   History of Present Illness  Pt presented with SOB and found to have acute on chronic diastolic CHF. PMH - HTN, DM, Copd, dementia, legally blind  Clinical Impression  Pt admitted with above diagnosis and presents to PT with functional limitations due to deficits listed below (See PT problem list). Pt needs skilled PT to maximize independence and safety to allow discharge to home with sister and HH.      Follow Up Recommendations Home health PT;Supervision for mobility/OOB    Equipment Recommendations  None recommended by PT    Recommendations for Other Services       Precautions / Restrictions Precautions Precautions: Fall Restrictions Weight Bearing Restrictions: No      Mobility  Bed Mobility Overal bed mobility: Needs Assistance Bed Mobility: Supine to Sit     Supine to sit: Min assist     General bed mobility comments: Pt on bsc.  Transfers Overall transfer level: Needs assistance Equipment used: Rolling walker (2 wheeled) Transfers: Sit to/from Stand Sit to Stand: Min guard Stand pivot transfers: Min assist;Mod assist       General transfer comment: Assist for safety and lines.  Ambulation/Gait Ambulation/Gait assistance: Min guard Gait Distance (Feet): 100 Feet Assistive device: Rolling walker (2 wheeled) Gait Pattern/deviations: Step-through pattern;Decreased stride length Gait velocity: decr Gait velocity interpretation: <1.8 ft/sec, indicate of risk for recurrent falls General Gait Details: Assist for safety and lines. Occasional cues to stay closer to the walker  Stairs            Wheelchair Mobility    Modified Rankin (Stroke Patients Only)       Balance Overall balance assessment: Needs assistance;History of Falls Sitting-balance support: Feet supported Sitting balance-Leahy Scale: Fair     Standing balance  support: Bilateral upper extremity supported Standing balance-Leahy Scale: Poor Standing balance comment: walker and min guard for static standing                             Pertinent Vitals/Pain Pain Assessment: Faces Faces Pain Scale: Hurts little more Pain Location: headache  Pain Descriptors / Indicators: Headache Pain Intervention(s): Monitored during session    Home Living Family/patient expects to be discharged to:: Private residence Living Arrangements: Other relatives (sister) Available Help at Discharge: Family           Home Equipment: Dan Humphreys - standard Additional Comments: Some information from prior encounter. Unsure of accuracy of pt's information    Prior Function Level of Independence: Needs assistance   Gait / Transfers Assistance Needed: amb in household modified independent  ADL's / Homemaking Assistance Needed: reports sister assists some with ADL tasks         Hand Dominance        Extremity/Trunk Assessment   Upper Extremity Assessment Upper Extremity Assessment: Defer to OT evaluation    Lower Extremity Assessment Lower Extremity Assessment: Generalized weakness       Communication   Communication: No difficulties  Cognition Arousal/Alertness: Awake/alert Behavior During Therapy: WFL for tasks assessed/performed Overall Cognitive Status: No family/caregiver present to determine baseline cognitive functioning                                 General Comments: per chart likely hx of dementia, pt with decreased command follow and difficulty  with processing instruction, requires up to max multimodal cues throughout to ensure safety.       General Comments General comments (skin integrity, edema, etc.): SpO2 > 90% on 3L    Exercises     Assessment/Plan    PT Assessment Patient needs continued PT services  PT Problem List Decreased strength;Decreased activity tolerance;Decreased balance;Decreased  mobility;Decreased cognition       PT Treatment Interventions DME instruction;Gait training;Functional mobility training;Therapeutic activities;Therapeutic exercise;Balance training;Patient/family education    PT Goals (Current goals can be found in the Care Plan section)  Acute Rehab PT Goals Patient Stated Goal: home PT Goal Formulation: With patient Time For Goal Achievement: 02/05/20 Potential to Achieve Goals: Good    Frequency Min 3X/week   Barriers to discharge        Co-evaluation               AM-PAC PT "6 Clicks" Mobility  Outcome Measure Help needed turning from your back to your side while in a flat bed without using bedrails?: A Little Help needed moving from lying on your back to sitting on the side of a flat bed without using bedrails?: A Little Help needed moving to and from a bed to a chair (including a wheelchair)?: A Little Help needed standing up from a chair using your arms (e.g., wheelchair or bedside chair)?: A Little Help needed to walk in hospital room?: A Little Help needed climbing 3-5 steps with a railing? : A Little 6 Click Score: 18    End of Session Equipment Utilized During Treatment: Gait belt Activity Tolerance: Patient tolerated treatment well Patient left: in chair;with call bell/phone within reach;with chair alarm set Nurse Communication: Mobility status PT Visit Diagnosis: Unsteadiness on feet (R26.81);Muscle weakness (generalized) (M62.81)    Time: 1055-1110 PT Time Calculation (min) (ACUTE ONLY): 15 min   Charges:   PT Evaluation $PT Eval Low Complexity: 1 Low          [facility] PT Acute Rehabilitation Services Pager [phone] Office [phone]   Angelina Ok [facility] 01/22/2020, 1:37 PM

## 2020-01-22 NOTE — Evaluation (Signed)
Occupational Therapy Evaluation Patient Details Name: Kristen Dennis MRN: [id number] DOB: 07/17/1955 Today's Date: 01/22/2020    History of Present Illness Pt presented with SOB and found to have acute on chronic diastolic CHF. PMH - HTN, DM, Copd, dementia, legally blind   Clinical Impression   This 64 y/o female presents with the above. PTA pt living with sister, reports sister assisting with ADL tasks, using AD (rollator vs RW) for mobility. Pt currently presenting with impaired cognition, decreased standing balance and mobility status. Pt with much difficulty managing RW, completing mobility and ADL tasks in room; suspect this is partly due to pt's baseline visual deficits and in unfamiliar environment as well as her cognitive status. Pt requiring up to modA for functional transfers using RW, maxA for LB ADL and minA for standing grooming ADL, requiring max multimodal cues throughout for safety and command follow. Noted pt much improved with mobility when moving on a straight trajectory. Pt on 3L with SpO2 >90% throughout. She will benefit from continued acute OT services, am hopeful pt will have less difficulty when completing ADL/mobility in her home/familiar environment - recommend follow up HHOT services and hands on 24hr assist (as she is currently at a higher risk for falls) after discharge to maximize her safety and independence with ADL and mobility.     Follow Up Recommendations  Home health OT;Supervision/Assistance - 24 hour    Equipment Recommendations  Tub/shower seat (need to confirm DME)           Precautions / Restrictions Precautions Precautions: Fall Restrictions Weight Bearing Restrictions: No      Mobility Bed Mobility Overal bed mobility: Needs Assistance Bed Mobility: Supine to Sit     Supine to sit: Min assist     General bed mobility comments: assist for trunk elevation via HHA  Transfers Overall transfer level: Needs assistance Equipment used:  Rolling walker (2 wheeled) Transfers: Sit to/from [employer] Sit to Stand: Min assist Stand pivot transfers: Min assist;Mod assist       General transfer comment: increased assist occasionally due to visual deficits and difficulty maneuvering in room, VCs for closer proximity to RW with taking steps and for hand placement with transitions     Balance Overall balance assessment: Needs assistance;History of Falls Sitting-balance support: Feet supported Sitting balance-Leahy Scale: Fair     Standing balance support: Bilateral upper extremity supported Standing balance-Leahy Scale: Poor Standing balance comment: reliant on UE support                            ADL either performed or assessed with clinical judgement   ADL Overall ADL's : Needs assistance/impaired Eating/Feeding: Set up;Sitting   Grooming: Minimal assistance;Standing;Wash/dry hands Grooming Details (indicate cue type and reason): assist for balance  Upper Body Bathing: Minimal assistance;Sitting   Lower Body Bathing: Moderate assistance;Sit to/from stand   Upper Body Dressing : Minimal assistance;Sitting   Lower Body Dressing: Maximal assistance;Sit to/from stand Lower Body Dressing Details (indicate cue type and reason): requires assist to don socks, unable to despite efforts at EOB Toilet Transfer: Minimal assistance;Moderate assistance;Stand-pivot;BSC Toilet Transfer Details (indicate cue type and reason): max multimodal cues for locating [facility]  Toileting- Clothing Manipulation and Hygiene: Moderate assistance;Sitting/lateral lean;Sit to/from stand Toileting - Clothing Manipulation Details (indicate cue type and reason): pt able to perform pericare after voiding bladder, assist for gowns and balance in standing      Functional mobility during ADLs: Minimal  assistance;Moderate assistance;Rolling walker General ADL Comments: pt with increased difficulty performing ADL/mobility tasks  given visual deficits      Vision Baseline Vision/History: Legally blind Additional Comments: max multimodal cues for locating items      Perception     Praxis      Pertinent Vitals/Pain Pain Assessment: Faces Faces Pain Scale: Hurts little more Pain Location: headache  Pain Descriptors / Indicators: Headache Pain Intervention(s): Monitored during session     Hand Dominance     Extremity/Trunk Assessment Upper Extremity Assessment Upper Extremity Assessment: Generalized weakness   Lower Extremity Assessment Lower Extremity Assessment: Defer to PT evaluation       Communication Communication Communication: No difficulties   Cognition Arousal/Alertness: Awake/alert Behavior During Therapy: WFL for tasks assessed/performed Overall Cognitive Status: No family/caregiver present to determine baseline cognitive functioning                                 General Comments: pt with decreased command follow and difficulty with processing instruction, requires increased multimodal cues throughout to ensure safety   General Comments  O2 sats >90% on 3L     Exercises     Shoulder Instructions      Home Living Family/patient expects to be discharged to:: Private residence Living Arrangements: Other relatives (sister) Available Help at Discharge: Family                                    Prior Functioning/Environment Level of Independence: Needs assistance    ADL's / Homemaking Assistance Needed: reports sister assists some with ADL tasks             OT Problem List: Decreased strength;Decreased activity tolerance;Decreased range of motion;Impaired balance (sitting and/or standing);Decreased cognition;Decreased safety awareness;Decreased knowledge of use of DME or AE;Decreased knowledge of precautions;Cardiopulmonary status limiting activity;Impaired vision/perception      OT Treatment/Interventions: Self-care/ADL training;Therapeutic  exercise;Energy conservation;DME and/or AE instruction;Therapeutic activities;Cognitive remediation/compensation;Visual/perceptual remediation/compensation;Patient/family education;Balance training    OT Goals(Current goals can be found in the care plan section) Acute Rehab OT Goals Patient Stated Goal: home OT Goal Formulation: With patient Time For Goal Achievement: 02/05/20 Potential to Achieve Goals: Good  OT Frequency: Min 2X/week   Barriers to D/C:            Co-evaluation              AM-PAC OT "6 Clicks" Daily Activity     Outcome Measure Help from another person eating meals?: A Little Help from another person taking care of personal grooming?: A Little Help from another person toileting, which includes using toliet, bedpan, or urinal?: A Lot Help from another person bathing (including washing, rinsing, drying)?: A Lot Help from another person to put on and taking off regular upper body clothing?: A Little Help from another person to put on and taking off regular lower body clothing?: A Lot 6 Click Score: 15   End of Session Equipment Utilized During Treatment: Gait belt;Rolling walker Nurse Communication: Mobility status  Activity Tolerance: Patient tolerated treatment well Patient left: Other (comment) (handoff to PT for session)  OT Visit Diagnosis: Unsteadiness on feet (R26.81);Other symptoms and signs involving cognitive function;Other abnormalities of gait and mobility (R26.89)                Time: 4944-9675 OT Time Calculation (min): 21 min Charges:  OT General Charges $OT Visit: 1 Visit OT Evaluation $OT Eval Moderate Complexity: 1 Mod  Marcy Siren, OT Acute Rehabilitation Services Pager [phone] Office [phone]   Orlando Penner 01/22/2020, 11:15 AM

## 2020-01-22 NOTE — Progress Notes (Signed)
   01/22/20 0900  Clinical Encounter Type  Visited With Patient  Visit Type Initial;Spiritual support  Referral From Patient;Nurse  Consult/Referral To Chaplain  Spiritual Encounters  Spiritual Needs Prayer   Chaplain engaged in initial visit with Kristen Dennis upon request for prayer.  During visit, chaplain learned that Kristen Dennis is one of about 14 children.  Family is extremely important to Kristen Dennis.  She voiced missing her mama who is deceased.  She noted, "I cry a lot because I miss my mama.  I had a good good mama."  She also discussed one of her sons being deceased from a car accident and her siblings who have passed on as well.  Chaplain could assess that Kristen Dennis has suffered some major losses and a lot of grief but she seems to have depended on God, faith and family.  She kept voicing to chaplain that she loves God.  Kristen Dennis also shared that two of her family members are preachers/pastors.  Chaplain offered support and the ministries of presence and prayer.  Chaplain will follow-up.

## 2020-01-23 LAB — CBC WITH DIFFERENTIAL/PLATELET
Abs Immature Granulocytes: 0.05 10*3/uL (ref 0.00–0.07)
Basophils Absolute: 0 10*3/uL (ref 0.0–0.1)
Basophils Relative: 0 %
Eosinophils Absolute: 0.1 10*3/uL (ref 0.0–0.5)
Eosinophils Relative: 1 %
HCT: 40 % (ref 36.0–46.0)
Hemoglobin: 11.9 g/dL — ABNORMAL LOW (ref 12.0–15.0)
Immature Granulocytes: 0 %
Lymphocytes Relative: 17 %
Lymphs Abs: 1.9 10*3/uL (ref 0.7–4.0)
MCH: 22 pg — ABNORMAL LOW (ref 26.0–34.0)
MCHC: 29.8 g/dL — ABNORMAL LOW (ref 30.0–36.0)
MCV: 73.8 fL — ABNORMAL LOW (ref 80.0–100.0)
Monocytes Absolute: 1 10*3/uL (ref 0.1–1.0)
Monocytes Relative: 9 %
Neutro Abs: 8.2 10*3/uL — ABNORMAL HIGH (ref 1.7–7.7)
Neutrophils Relative %: 73 %
Platelets: 395 10*3/uL (ref 150–400)
RBC: 5.42 MIL/uL — ABNORMAL HIGH (ref 3.87–5.11)
RDW: 21.2 % — ABNORMAL HIGH (ref 11.5–15.5)
WBC: 11.2 10*3/uL — ABNORMAL HIGH (ref 4.0–10.5)
nRBC: 0 % (ref 0.0–0.2)

## 2020-01-23 LAB — GLUCOSE, CAPILLARY
Glucose-Capillary: 77 mg/dL (ref 70–99)
Glucose-Capillary: 156 mg/dL — ABNORMAL HIGH (ref 70–99)

## 2020-01-23 NOTE — Progress Notes (Signed)
PROGRESS NOTE    Kristen Dennis  WLN:[id number] DOB: Jan 06, 1956 DOA: 01/18/2020 PCP: Claudean Severance, MD    Brief Narrative:  Patient with cor pulmonale and right-sided heart failure admitted with shortness of breath.  Stabilized.  Going home today.   Assessment & Plan:   Principal Problem:   Acute on chronic diastolic CHF (congestive heart failure) (HCC) Active Problems:   Hypertension associated with diabetes (HCC)   Diabetes mellitus without complication (HCC)   COPD (chronic obstructive pulmonary disease) (HCC)   Dementia (HCC)   Leukocytosis   Respiratory failure, acute (HCC)  1. Acute on chronic hypoxic respiratory failure:Multifactorial. Acute diastolic heart failure, pulmonary hypertension, cor pulmonale: Treated with BiPAP.   Of BiPAP for 3 days.  Currently clinically improving. Remains on 3L oxygen today. Good response to diuresis.  Due to significant right-sided findings on echocardiogram, a CTA of the chest was done and compared to CTA from last month. 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  Negative for PE. Extremity duplex is negative for DVT.  Treated with IV Lasix. Will change to oral today on discharge. Probably has underlying sarcoidosis, will treat with trial of prednisone, prolonged taper and referral to pulmonary as outpatient for follow-up. Continue Singulair and as needed breathing treatments. Leukocytosis normalized.  No antibiotics were given.  2. Type 2 diabetes on insulin with hyperglycemia:Well controlled at home. Remains on Lantus and sliding scale insulin. Holding Metformin. Hypoglycemic episodes improved with reduced dose of insulin to 30 units at night. Her sister is going to monitor her blood sugars at home, if they are adequately above 200, she will start using her home dose of insulin.  3.Hypertension:Blood pressures better on amlodipine, hydralazine, lisinopril low  pressure.  4. Depression/dementia:Continue Cymbalta and donepezil.  Stable to discharge today.  Unfortunately, last night we had to hold her discharge because family arrived with no oxygen.  DVT prophylaxis: enoxaparin (LOVENOX) injection 40 mg Start: 01/19/20 1000   Code Status: Full code Family Communication: Sister Disposition Plan: Status is: Inpatient  Dispo:  Patient From: Home  Planned Disposition: Home  Expected discharge date: 01/22/20, discharging today  Medically stable for discharge: Yes         Consultants:   None  Procedures:   None  Antimicrobials:   None   Subjective: Patient seen and examined.  Very cheerful.  Remains on 3 L oxygen or less than 3 L most of the night.  Denies any cough congestion or shortness of breath.  Objective: Vitals:   01/22/20 2137 01/23/20 0015 01/23/20 0347 01/23/20 0800  BP: (!) 118/50 115/76 135/82 138/74  Pulse:  (!) 59  61  Resp:  17 14 17   Temp:  97.6 F (36.4 C) 97.7 F (36.5 C) 97.9 F (36.6 C)  TempSrc:  Oral Oral Oral  SpO2:  100%  92%  Weight:   85.2 kg   Height:        Intake/Output Summary (Last 24 hours) at 01/23/2020 0823 Last data filed at 01/23/2020 0349 Gross per 24 hour  Intake 123 ml  Output 600 ml  Net -477 ml   Filed Weights   01/21/20 0536 01/22/20 0448 01/23/20 0347  Weight: 87.5 kg 85.2 kg 85.2 kg    Examination: Physical Exam Constitutional:      Appearance: She is well-developed.     Comments: Patient has poor vision.  She has some cognitive dysfunction but she is alert oriented x3.  HENT:     Head:  Atraumatic.  Cardiovascular:     Rate and Rhythm: Regular rhythm.     Comments: No edema Pulmonary:     Breath sounds: Normal breath sounds.     Comments: No added sounds. Abdominal:     General: Bowel sounds are normal.     Palpations: Abdomen is soft.  Neurological:     Mental Status: She is alert.       Data Reviewed: I have personally reviewed following labs  and imaging studies  CBC: Recent Labs  Lab 01/18/20 2059 01/18/20 2059 01/18/20 2103 01/19/20 0225 01/21/20 0300 01/22/20 0212 01/23/20 0337  WBC 23.7*  --   --  19.4* 13.3* 11.6* 11.2*  NEUTROABS 19.6*  --   --   --  12.0* 9.3* 8.2*  HGB 12.3   < > 14.6 12.2 10.9* 11.1* 11.9*  HCT 41.8   < > 43.0 40.6 37.0 37.6 40.0  MCV 75.3*  --   --  73.6* 74.3* 73.2* 73.8*  PLT 512*  --   --  433* 354 370 395   < > = values in this interval not displayed.   Basic Metabolic Panel: Recent Labs  Lab 01/18/20 2059 01/18/20 2103 01/19/20 0225 01/21/20 0300  NA 138 139 136 139  K 4.4 4.3 4.7 4.9  CL 105  --  103 103  CO2 19*  --  18* 24  GLUCOSE 236*  --  329* 178*  BUN 12  --  14 17  CREATININE 1.21*  --  1.21* 0.90  CALCIUM 8.4*  --  8.4* 9.0  MG  --   --  1.7 1.9  PHOS  --   --   --  4.1   GFR: Estimated Creatinine Clearance: 68.6 mL/min (by C-G formula based on SCr of 0.9 mg/dL). Liver Function Tests: Recent Labs  Lab 01/18/20 2059  AST 29  ALT 19  ALKPHOS 106  BILITOT 0.8  PROT 6.8  ALBUMIN 2.4*   No results for input(s): LIPASE, AMYLASE in the last 168 hours. No results for input(s): AMMONIA in the last 168 hours. Coagulation Profile: No results for input(s): INR, PROTIME in the last 168 hours. Cardiac Enzymes: No results for input(s): CKTOTAL, CKMB, CKMBINDEX, TROPONINI in the last 168 hours. BNP (last 3 results) No results for input(s): PROBNP in the last 8760 hours. HbA1C: No results for input(s): HGBA1C in the last 72 hours. CBG: Recent Labs  Lab 01/22/20 0638 01/22/20 1127 01/22/20 1645 01/22/20 2125 01/23/20 0639  GLUCAP 96 112* 246* 259* 77   Lipid Profile: No results for input(s): CHOL, HDL, LDLCALC, TRIG, CHOLHDL, LDLDIRECT in the last 72 hours. Thyroid Function Tests: No results for input(s): TSH, T4TOTAL, FREET4, T3FREE, THYROIDAB in the last 72 hours. Anemia Panel: No results for input(s): VITAMINB12, FOLATE, FERRITIN, TIBC, IRON, RETICCTPCT  in the last 72 hours. Sepsis Labs: No results for input(s): PROCALCITON, LATICACIDVEN in the last 168 hours.  Recent Results (from the past 240 hour(s))  SARS Coronavirus 2 by RT PCR (hospital order, performed in [facility] hospital lab) Nasopharyngeal Nasopharyngeal Swab     Status: None   Collection Time: 01/18/20  9:20 PM   Specimen: Nasopharyngeal Swab  Result Value Ref Range Status   SARS Coronavirus 2 NEGATIVE NEGATIVE Final    Comment: (NOTE) SARS-CoV-2 target nucleic acids are NOT DETECTED.  The SARS-CoV-2 RNA is generally detectable in upper and lower respiratory specimens during the acute phase of infection. The lowest concentration of SARS-CoV-2 viral copies this assay  can detect is 250 copies / mL. A negative result does not preclude SARS-CoV-2 infection and should not be used as the sole basis for treatment or other patient management decisions.  A negative result may occur with improper specimen collection / handling, submission of specimen other than nasopharyngeal swab, presence of viral mutation(s) within the areas targeted by this assay, and inadequate number of viral copies (<250 copies / mL). A negative result must be combined with clinical observations, patient history, and epidemiological information.  Fact Sheet for Patients:   BoilerBrush.com.cy  Fact Sheet for Healthcare Providers: https://pope.com/  This test is not yet approved or  cleared by the [place] FDA and has been authorized for detection and/or diagnosis of SARS-CoV-2 by FDA under an Emergency Use Authorization (EUA).  This EUA will remain in effect (meaning this test can be used) for the duration of the COVID-19 declaration under Section 564(b)(1) of the Act, 21 U.S.C. section 360bbb-3(b)(1), unless the authorization is terminated or revoked sooner.  Performed at [facility], [address], [place], [place] [address]   MRSA PCR  Screening     Status: None   Collection Time: 01/19/20  1:20 AM   Specimen: Nasopharyngeal  Result Value Ref Range Status   MRSA by PCR NEGATIVE NEGATIVE Final    Comment:        The GeneXpert MRSA Assay (FDA approved for NASAL specimens only), is one component of a comprehensive MRSA colonization surveillance program. It is not intended to diagnose MRSA infection nor to guide or monitor treatment for MRSA infections. Performed at [facility], [address], [place], [place] [address]          Radiology Studies: No results found.      Scheduled Meds: . amLODipine  5 mg Oral Daily  . donepezil  5 mg Oral QHS  . DULoxetine  60 mg Oral QHS  . enoxaparin (LOVENOX) injection  40 mg Subcutaneous Q24H  . furosemide  40 mg Intravenous Q12H  . hydrALAZINE  50 mg Oral TID  . insulin aspart  0-15 Units Subcutaneous TID WC  . insulin aspart  0-5 Units Subcutaneous QHS  . insulin glargine  30 Units Subcutaneous QHS  . lisinopril  10 mg Oral QHS  . metoprolol tartrate  50 mg Oral BID  . montelukast  10 mg Oral QHS  . potassium chloride  10 mEq Oral Daily  . predniSONE  40 mg Oral Q breakfast  . pregabalin  100 mg Oral TID  . sodium chloride flush  3 mL Intravenous Q12H   Continuous Infusions: . sodium chloride       LOS: 4 days    Time spent: 25 minutes    Dorcas Carrow, MD [facility] Pager [phone]

## 2020-01-23 NOTE — Plan of Care (Signed)

## 2020-01-23 NOTE — Care Management Important Message (Signed)
Important Message  Patient Details  Name: Tiffani Kadow MRN: [id number] Date of Birth: 07/26/1955   Medicare Important Message Given:  Yes     Namir Neto Stefan [place] 01/23/2020, 4:16 PM

## 2020-01-23 NOTE — Progress Notes (Signed)
Patient discharged home with sister, Veva Holes. Discharge instructions given to [facility], the patients caregiver. She verbalized understanding of discharge instructions as well as new medication to pick up from [facility].

## 2020-02-20 ENCOUNTER — Ambulatory Visit
Admission: RE | Admit: 2020-02-20 | Discharge: 2020-02-20 | Disposition: A | Discharge status: Home / Self Care | Payer: Medicare PPO | Source: Ambulatory Visit | Attending: Physician Assistant | Admitting: Physician Assistant

## 2020-02-20 ENCOUNTER — Other Ambulatory Visit: Payer: Self-pay | Admitting: Physician Assistant

## 2020-02-20 DIAGNOSIS — R0989 Other specified symptoms and signs involving the circulatory and respiratory systems: Secondary | ICD-10-CM

## 2020-02-28 ENCOUNTER — Inpatient Hospital Stay (HOSPITAL_COMMUNITY)
Admission: EM | Admit: 2020-02-28 | Discharge: 2020-03-03 | DRG: 871 | Disposition: A | Discharge status: Home Health | Payer: Medicare PPO | Attending: Internal Medicine | Admitting: Internal Medicine

## 2020-02-28 ENCOUNTER — Encounter (HOSPITAL_COMMUNITY): Payer: Self-pay

## 2020-02-28 ENCOUNTER — Emergency Department (HOSPITAL_COMMUNITY): Payer: Medicare PPO

## 2020-02-28 ENCOUNTER — Other Ambulatory Visit: Payer: Self-pay

## 2020-02-28 DIAGNOSIS — I110 Hypertensive heart disease with heart failure: Secondary | ICD-10-CM | POA: Diagnosis present

## 2020-02-28 DIAGNOSIS — J440 Chronic obstructive pulmonary disease with acute lower respiratory infection: Secondary | ICD-10-CM | POA: Diagnosis present

## 2020-02-28 DIAGNOSIS — J449 Chronic obstructive pulmonary disease, unspecified: Secondary | ICD-10-CM | POA: Diagnosis present

## 2020-02-28 DIAGNOSIS — A403 Sepsis due to Streptococcus pneumoniae: Secondary | ICD-10-CM | POA: Diagnosis not present

## 2020-02-28 DIAGNOSIS — Z79899 Other long term (current) drug therapy: Secondary | ICD-10-CM

## 2020-02-28 DIAGNOSIS — I50810 Right heart failure, unspecified: Secondary | ICD-10-CM | POA: Diagnosis present

## 2020-02-28 DIAGNOSIS — E872 Acidosis: Secondary | ICD-10-CM | POA: Diagnosis present

## 2020-02-28 DIAGNOSIS — H548 Legal blindness, as defined in USA: Secondary | ICD-10-CM | POA: Diagnosis present

## 2020-02-28 DIAGNOSIS — Z7951 Long term (current) use of inhaled steroids: Secondary | ICD-10-CM

## 2020-02-28 DIAGNOSIS — I509 Heart failure, unspecified: Secondary | ICD-10-CM

## 2020-02-28 DIAGNOSIS — I2721 Secondary pulmonary arterial hypertension: Secondary | ICD-10-CM | POA: Diagnosis present

## 2020-02-28 DIAGNOSIS — I2781 Cor pulmonale (chronic): Secondary | ICD-10-CM | POA: Diagnosis present

## 2020-02-28 DIAGNOSIS — I5033 Acute on chronic diastolic (congestive) heart failure: Secondary | ICD-10-CM | POA: Diagnosis present

## 2020-02-28 DIAGNOSIS — J189 Pneumonia, unspecified organism: Secondary | ICD-10-CM

## 2020-02-28 DIAGNOSIS — J9601 Acute respiratory failure with hypoxia: Secondary | ICD-10-CM | POA: Diagnosis not present

## 2020-02-28 DIAGNOSIS — A419 Sepsis, unspecified organism: Secondary | ICD-10-CM | POA: Diagnosis present

## 2020-02-28 DIAGNOSIS — Z9981 Dependence on supplemental oxygen: Secondary | ICD-10-CM

## 2020-02-28 DIAGNOSIS — E669 Obesity, unspecified: Secondary | ICD-10-CM | POA: Diagnosis present

## 2020-02-28 DIAGNOSIS — Z87891 Personal history of nicotine dependence: Secondary | ICD-10-CM

## 2020-02-28 DIAGNOSIS — Z6835 Body mass index (BMI) 35.0-35.9, adult: Secondary | ICD-10-CM

## 2020-02-28 DIAGNOSIS — E1165 Type 2 diabetes mellitus with hyperglycemia: Secondary | ICD-10-CM | POA: Diagnosis present

## 2020-02-28 DIAGNOSIS — D649 Anemia, unspecified: Secondary | ICD-10-CM | POA: Diagnosis present

## 2020-02-28 DIAGNOSIS — Z8673 Personal history of transient ischemic attack (TIA), and cerebral infarction without residual deficits: Secondary | ICD-10-CM

## 2020-02-28 DIAGNOSIS — J154 Pneumonia due to other streptococci: Secondary | ICD-10-CM | POA: Diagnosis present

## 2020-02-28 DIAGNOSIS — J9621 Acute and chronic respiratory failure with hypoxia: Secondary | ICD-10-CM | POA: Diagnosis present

## 2020-02-28 DIAGNOSIS — G9341 Metabolic encephalopathy: Secondary | ICD-10-CM | POA: Diagnosis present

## 2020-02-28 DIAGNOSIS — N179 Acute kidney failure, unspecified: Secondary | ICD-10-CM | POA: Diagnosis present

## 2020-02-28 DIAGNOSIS — F0390 Unspecified dementia without behavioral disturbance: Secondary | ICD-10-CM | POA: Diagnosis present

## 2020-02-28 DIAGNOSIS — J441 Chronic obstructive pulmonary disease with (acute) exacerbation: Secondary | ICD-10-CM | POA: Diagnosis present

## 2020-02-28 DIAGNOSIS — Z8661 Personal history of infections of the central nervous system: Secondary | ICD-10-CM

## 2020-02-28 DIAGNOSIS — Z8249 Family history of ischemic heart disease and other diseases of the circulatory system: Secondary | ICD-10-CM

## 2020-02-28 DIAGNOSIS — Z20822 Contact with and (suspected) exposure to covid-19: Secondary | ICD-10-CM | POA: Diagnosis present

## 2020-02-28 DIAGNOSIS — Z794 Long term (current) use of insulin: Secondary | ICD-10-CM

## 2020-02-28 DIAGNOSIS — R6520 Severe sepsis without septic shock: Secondary | ICD-10-CM | POA: Diagnosis present

## 2020-02-28 LAB — CBC WITH DIFFERENTIAL/PLATELET
Abs Immature Granulocytes: 0.18 10*3/uL — ABNORMAL HIGH (ref 0.00–0.07)
Basophils Absolute: 0.1 10*3/uL (ref 0.0–0.1)
Basophils Relative: 0 %
Eosinophils Absolute: 0 10*3/uL (ref 0.0–0.5)
Eosinophils Relative: 0 %
HCT: 39.1 % (ref 36.0–46.0)
Hemoglobin: 11.2 g/dL — ABNORMAL LOW (ref 12.0–15.0)
Immature Granulocytes: 2 %
Lymphocytes Relative: 9 %
Lymphs Abs: 1 10*3/uL (ref 0.7–4.0)
MCH: 22.4 pg — ABNORMAL LOW (ref 26.0–34.0)
MCHC: 28.6 g/dL — ABNORMAL LOW (ref 30.0–36.0)
MCV: 78 fL — ABNORMAL LOW (ref 80.0–100.0)
Monocytes Absolute: 0.8 10*3/uL (ref 0.1–1.0)
Monocytes Relative: 7 %
Neutro Abs: 9.4 10*3/uL — ABNORMAL HIGH (ref 1.7–7.7)
Neutrophils Relative %: 82 %
Platelets: 437 10*3/uL — ABNORMAL HIGH (ref 150–400)
RBC: 5.01 MIL/uL (ref 3.87–5.11)
RDW: 22.7 % — ABNORMAL HIGH (ref 11.5–15.5)
WBC: 11.5 10*3/uL — ABNORMAL HIGH (ref 4.0–10.5)
nRBC: 0.4 % — ABNORMAL HIGH (ref 0.0–0.2)

## 2020-02-28 LAB — BASIC METABOLIC PANEL
Anion gap: 16 — ABNORMAL HIGH (ref 5–15)
BUN: 21 mg/dL (ref 8–23)
CO2: 11 mmol/L — ABNORMAL LOW (ref 22–32)
Calcium: 8.1 mg/dL — ABNORMAL LOW (ref 8.9–10.3)
Chloride: 109 mmol/L (ref 98–111)
Creatinine, Ser: 1.82 mg/dL — ABNORMAL HIGH (ref 0.44–1.00)
GFR calc Af Amer: 33 mL/min — ABNORMAL LOW (ref >60)
GFR calc non Af Amer: 29 mL/min — ABNORMAL LOW (ref >60)
Glucose, Bld: 183 mg/dL — ABNORMAL HIGH (ref 70–99)
Potassium: 5.8 mmol/L — ABNORMAL HIGH (ref 3.5–5.1)
Sodium: 136 mmol/L (ref 135–145)

## 2020-02-28 LAB — TROPONIN I (HIGH SENSITIVITY): Troponin I (High Sensitivity): 27 ng/L — ABNORMAL HIGH (ref <18)

## 2020-02-28 LAB — BRAIN NATRIURETIC PEPTIDE: B Natriuretic Peptide: 982.9 pg/mL — ABNORMAL HIGH (ref 0.0–100.0)

## 2020-02-28 LAB — SARS CORONAVIRUS 2 BY RT PCR (HOSPITAL ORDER, PERFORMED IN ~~LOC~~ HOSPITAL LAB): SARS Coronavirus 2: NEGATIVE

## 2020-02-28 LAB — LACTIC ACID, PLASMA: Lactic Acid, Venous: 5.7 mmol/L (ref 0.5–1.9)

## 2020-02-28 LAB — I-STAT CREATININE, ED: Creatinine, Ser: 2 mg/dL — ABNORMAL HIGH (ref 0.44–1.00)

## 2020-02-28 MED ORDER — IPRATROPIUM BROMIDE HFA 17 MCG/ACT IN AERS
4.0000 | INHALATION_SPRAY | Freq: Once | RESPIRATORY_TRACT | Status: AC
  Administered 2020-02-28: 4 via RESPIRATORY_TRACT
  Filled 2020-02-28: qty 12.9

## 2020-02-28 MED ORDER — LACTATED RINGERS IV SOLN: INTRAVENOUS | Status: DC

## 2020-02-28 MED ORDER — SODIUM CHLORIDE 0.9 % IV SOLN
500.0000 mg | INTRAVENOUS | Status: DC
  Administered 2020-02-28 – 2020-03-01 (×3): 500 mg via INTRAVENOUS
  Filled 2020-02-28 (×2): qty 500

## 2020-02-28 MED ORDER — AEROCHAMBER PLUS FLO-VU LARGE MISC
Status: AC
  Filled 2020-02-28: qty 1

## 2020-02-28 MED ORDER — ALBUTEROL SULFATE HFA 108 (90 BASE) MCG/ACT IN AERS
8.0000 | INHALATION_SPRAY | Freq: Once | RESPIRATORY_TRACT | Status: AC
  Administered 2020-02-28: 8 via RESPIRATORY_TRACT
  Filled 2020-02-28: qty 6.7

## 2020-02-28 MED ORDER — MAGNESIUM SULFATE 2 GM/50ML IV SOLN
2.0000 g | Freq: Once | INTRAVENOUS | Status: AC
  Administered 2020-02-28: 2 g via INTRAVENOUS
  Filled 2020-02-28: qty 50

## 2020-02-28 MED ORDER — LACTATED RINGERS IV BOLUS (SEPSIS)
1500.0000 mL | Freq: Once | INTRAVENOUS | Status: AC
  Administered 2020-02-29: 1500 mL via INTRAVENOUS

## 2020-02-28 MED ORDER — AEROCHAMBER PLUS FLO-VU LARGE MISC
1.0000 | Freq: Once | Status: AC
  Administered 2020-02-28: 1

## 2020-02-28 MED ORDER — SODIUM CHLORIDE 0.9 % IV SOLN
2.0000 g | INTRAVENOUS | Status: DC
  Administered 2020-02-28 – 2020-03-01 (×3): 2 g via INTRAVENOUS
  Filled 2020-02-28 (×4): qty 20

## 2020-02-28 MED ORDER — METHYLPREDNISOLONE SODIUM SUCC 125 MG IJ SOLR
125.0000 mg | Freq: Once | INTRAMUSCULAR | Status: AC
  Administered 2020-02-28: 125 mg via INTRAVENOUS
  Filled 2020-02-28: qty 2

## 2020-02-28 MED ORDER — LACTATED RINGERS IV BOLUS (SEPSIS)
1000.0000 mL | Freq: Once | INTRAVENOUS | Status: AC
  Administered 2020-02-28: 1000 mL via INTRAVENOUS

## 2020-02-28 MED ORDER — ALBUTEROL SULFATE (2.5 MG/3ML) 0.083% IN NEBU: 10.0000 mg | INHALATION_SOLUTION | Freq: Once | RESPIRATORY_TRACT | Status: DC

## 2020-02-28 NOTE — ED Provider Notes (Signed)
[facility] EMERGENCY DEPARTMENT Provider Note   CSN: [id number] Arrival date & time: 02/28/20  2127     History No chief complaint on file.   Kristen Dennis is a 64 y.o. female.  The history is provided by the patient and the EMS personnel.  Shortness of Breath Severity:  Moderate Onset quality:  Sudden Duration:  3 hours Timing:  Constant Progression:  Unchanged Chronicity:  Chronic Relieved by:  Oxygen Worsened by:  Nothing Ineffective treatments:  None tried Associated symptoms: cough and headaches   Associated symptoms: no abdominal pain, no chest pain, no ear pain, no fever, no rash, no sore throat, no syncope and no vomiting   Risk factors comment:  COPD, CHF      Past Medical History:  Diagnosis Date  . Asthma   . Diabetes mellitus without complication (HCC)   . Encephalitis   . Hypertension   . Legally blind   . Renal disease   . Stroke [facility])    2011    Patient Active Problem List   Diagnosis Date Noted  . Acute respiratory failure with hypoxia (HCC) 02/29/2020  . ARF (acute renal failure) (HCC) 02/29/2020  . CAP (community acquired pneumonia) 02/29/2020  . Sepsis (HCC) 02/29/2020  . Acute on chronic diastolic CHF (congestive heart failure) (HCC) 01/19/2020  . COPD (chronic obstructive pulmonary disease) (HCC) 01/19/2020  . Dementia (HCC) 01/19/2020  . Leukocytosis 01/19/2020  . Respiratory failure, acute (HCC) 01/19/2020  . Asthma exacerbation 07/15/2017  . Asthma exacerbation, mild 07/15/2017  . Hypertension associated with diabetes (HCC)   . Diabetes mellitus without complication (HCC)   . Asthma   . Renal disease     Past Surgical History:  Procedure Laterality Date  . CHOLECYSTECTOMY       OB History   No obstetric history on file.     Family History  Problem Relation Age of Onset  . Cancer Mother   . Heart failure Father     Social History   Tobacco Use  . Smoking status: Former Games developer  . Smokeless  tobacco: Never Used  Vaping Use  . Vaping Use: Never used  Substance Use Topics  . Alcohol use: No  . Drug use: No    Home Medications Prior to Admission medications   Medication Sig Start Date End Date Taking? Authorizing Provider  albuterol (PROVENTIL HFA;VENTOLIN HFA) 108 (90 Base) MCG/ACT inhaler Inhale 2 puffs into the lungs every 6 (six) hours as needed for wheezing or shortness of breath. 07/18/17   Maxie Barb, MD  amLODipine (NORVASC) 5 MG tablet Take 5 mg by mouth daily. 05/16/17   [provider]  donepezil (ARICEPT) 5 MG tablet Take 5 mg by mouth at bedtime. 05/16/17   [provider]  DULoxetine (CYMBALTA) 60 MG capsule Take 60 mg by mouth at bedtime. 03/14/17   [provider]  ergocalciferol (VITAMIN D2) 1.25 MG (50000 UT) capsule Take 1 capsule by mouth once a week. 01/09/20   [provider]  fluticasone furoate-vilanterol (BREO ELLIPTA) 100-25 MCG/INH AEPB Inhale 1 puff into the lungs daily. 12/29/19   [provider]  furosemide (LASIX) 40 MG tablet Take 40 mg by mouth daily.    [provider]  hydrALAZINE (APRESOLINE) 50 MG tablet Take 50 mg by mouth 3 (three) times daily. 07/10/17   [provider]  insulin glargine (LANTUS SOLOSTAR) 100 UNIT/ML Solostar Pen Inject 50 Units into the skin at bedtime.  06/03/19   [provider]  lisinopril (PRINIVIL,ZESTRIL) 10 MG tablet Take 10 mg by mouth every evening.  07/10/17   [provider]  metFORMIN (GLUCOPHAGE) 500 MG tablet Take 1,000 mg by mouth 2 (two) times daily. 05/18/17   [provider]  metoprolol tartrate (LOPRESSOR) 50 MG tablet Take 50 mg by mouth 2 (two) times daily. 07/10/17   [provider]  montelukast (SINGULAIR) 10 MG tablet Take 10 mg by mouth at bedtime. 07/10/17   [provider]  omeprazole (PRILOSEC) 20 MG capsule Take 20 mg by mouth every evening.  04/14/17   [provider]  potassium  chloride SA (K-DUR,KLOR-CON) 20 MEQ tablet Take 20 mEq by mouth daily.    [provider]  predniSONE (DELTASONE) 10 MG tablet 4 tablet daily  for 3 days 3 tablet daily for 3 days 2 tablet daily for 3 days 1 tablet daily for 3 days 01/22/20   Dorcas Carrow, MD  pregabalin (LYRICA) 100 MG capsule Take 1 capsule (100 mg total) by mouth 3 (three) times daily. 09/22/17   Fayrene Helper, PA-C  sitaGLIPtin (JANUVIA) 25 MG tablet Take 25 mg by mouth daily.  12/24/18   [provider]    Allergies    Patient has no known allergies.  Review of Systems   Review of Systems  Constitutional: Negative for chills and fever.  HENT: Negative for ear pain and sore throat.   Eyes: Negative for pain and visual disturbance.  Respiratory: Positive for cough and shortness of breath.   Cardiovascular: Negative for chest pain, palpitations and syncope.  Gastrointestinal: Negative for abdominal pain and vomiting.  Genitourinary: Negative for dysuria and hematuria.  Musculoskeletal: Negative for arthralgias and back pain.  Skin: Negative for color change and rash.  Neurological: Positive for weakness (generalized) and headaches. Negative for seizures and syncope.  All other systems reviewed and are negative.   Physical Exam Updated Vital Signs BP (!) 135/58 (BP Location: Left Arm)   Pulse 62   Temp 98.5 F (36.9 C) (Rectal)   Resp 16   Ht 5' (1.524 m)   Wt 81.6 kg   LMP 05/03/2012   SpO2 96%   BMI 35.15 kg/m   Physical Exam Vitals and nursing note reviewed.  Constitutional:      General: She is not in acute distress.    Appearance: She is well-developed.     Comments: Appears uncomfortable.  HENT:     Head: Normocephalic and atraumatic.  Eyes:     Conjunctiva/sclera: Conjunctivae normal.  Cardiovascular:     Rate and Rhythm: Regular rhythm. Tachycardia present.     Heart sounds: No murmur heard.   Pulmonary:     Effort: Pulmonary effort is normal. No respiratory distress.      Breath sounds: Wheezing (throughout) present.  Abdominal:     Palpations: Abdomen is soft.     Tenderness: There is no abdominal tenderness. There is no guarding or rebound.  Musculoskeletal:     Cervical back: Neck supple.     Comments: 2+ LE pitting edema bl.  Skin:    General: Skin is warm and dry.     Capillary Refill: Capillary refill takes less than 2 seconds.  Neurological:     General: No focal deficit present.     Mental Status: She is alert and oriented to person, place, and time.     Cranial Nerves: No cranial nerve deficit.  Psychiatric:        Behavior: Behavior normal.  ED Results / Procedures / Treatments   Labs (all labs ordered are listed, but only abnormal results are displayed) Labs Reviewed  CBC WITH DIFFERENTIAL/PLATELET - Abnormal; Notable for the following components:      Result Value   WBC 11.5 (*)    Hemoglobin 11.2 (*)    MCV 78.0 (*)    MCH 22.4 (*)    MCHC 28.6 (*)    RDW 22.7 (*)    Platelets 437 (*)    nRBC 0.4 (*)    Neutro Abs 9.4 (*)    Abs Immature Granulocytes 0.18 (*)    All other components within normal limits  BASIC METABOLIC PANEL - Abnormal; Notable for the following components:   Potassium 5.8 (*)    CO2 11 (*)    Glucose, Bld 183 (*)    Creatinine, Ser 1.82 (*)    Calcium 8.1 (*)    GFR calc non Af Amer 29 (*)    GFR calc Af Amer 33 (*)    Anion gap 16 (*)    All other components within normal limits  BRAIN NATRIURETIC PEPTIDE - Abnormal; Notable for the following components:   B Natriuretic Peptide 982.9 (*)    All other components within normal limits  LACTIC ACID, PLASMA - Abnormal; Notable for the following components:   Lactic Acid, Venous 5.7 (*)    All other components within normal limits  LACTIC ACID, PLASMA - Abnormal; Notable for the following components:   Lactic Acid, Venous 5.7 (*)    All other components within normal limits  URINALYSIS, ROUTINE W REFLEX MICROSCOPIC - Abnormal; Notable for the  following components:   Color, Urine AMBER (*)    APPearance HAZY (*)    Ketones, ur 5 (*)    Protein, ur 30 (*)    All other components within normal limits  BASIC METABOLIC PANEL - Abnormal; Notable for the following components:   CO2 16 (*)    Glucose, Bld 201 (*)    Creatinine, Ser 1.77 (*)    Calcium 8.4 (*)    GFR calc non Af Amer 30 (*)    GFR calc Af Amer 34 (*)    All other components within normal limits  CBC WITH DIFFERENTIAL/PLATELET - Abnormal; Notable for the following components:   Hemoglobin 11.1 (*)    MCH 22.3 (*)    MCHC 27.5 (*)    RDW 22.4 (*)    Neutro Abs 7.9 (*)    Abs Immature Granulocytes 0.09 (*)    All other components within normal limits  COMPREHENSIVE METABOLIC PANEL - Abnormal; Notable for the following components:   CO2 12 (*)    Glucose, Bld 223 (*)    BUN 26 (*)    Creatinine, Ser 1.50 (*)    Calcium 8.1 (*)    Total Protein 5.9 (*)    Albumin 2.3 (*)    GFR calc non Af Amer 36 (*)    GFR calc Af Amer 42 (*)    Anion gap 16 (*)    All other components within normal limits  LACTIC ACID, PLASMA - Abnormal; Notable for the following components:   Lactic Acid, Venous 2.0 (*)    All other components within normal limits  I-STAT CREATININE, ED - Abnormal; Notable for the following components:   Creatinine, Ser 2.00 (*)    All other components within normal limits  I-STAT ARTERIAL BLOOD GAS, ED - Abnormal; Notable for the following components:   pH, Arterial  7.330 (*)    pCO2 arterial 29.8 (*)    pO2, Arterial 60 (*)    Bicarbonate 15.7 (*)    TCO2 17 (*)    Acid-base deficit 9.0 (*)    HCT 35.0 (*)    Hemoglobin 11.9 (*)    All other components within normal limits  CBG MONITORING, ED - Abnormal; Notable for the following components:   Glucose-Capillary 263 (*)    All other components within normal limits  TROPONIN I (HIGH SENSITIVITY) - Abnormal; Notable for the following components:   Troponin I (High Sensitivity) 27 (*)    All  other components within normal limits  TROPONIN I (HIGH SENSITIVITY) - Abnormal; Notable for the following components:   Troponin I (High Sensitivity) 43 (*)    All other components within normal limits  SARS CORONAVIRUS 2 BY RT PCR (HOSPITAL ORDER, PERFORMED IN [facility])  CULTURE, BLOOD (ROUTINE X 2)  CULTURE, BLOOD (ROUTINE X 2)  URINE CULTURE  EXPECTORATED SPUTUM ASSESSMENT W REFEX TO RESP CULTURE  PROTIME-INR  APTT  ALKALINE PHOSPHATASE  ALT  AST  BILIRUBIN, TOTAL  PROCALCITONIN  APTT  LEGIONELLA PNEUMOPHILA SEROGP 1 UR AG  STREP PNEUMONIAE URINARY ANTIGEN  CBC  I-STAT VENOUS BLOOD GAS, ED    EKG EKG Interpretation  Date/Time:  Friday February 28 2020 21:38:20 EDT Ventricular Rate:  60 PR Interval:    QRS Duration: 72 QT Interval:  491 QTC Calculation: 491 R Axis:   136 Text Interpretation: Sinus rhythm Right ventricular hypertrophy Borderline T abnormalities, anterior leads new Borderline prolonged QT interval Confirmed by Gwyneth Sprout ([id number]) on 02/28/2020 9:41:49 PM   Radiology DG Chest Portable 1 View  Result Date: 02/28/2020 CLINICAL DATA:  Shortness of breath. EXAM: PORTABLE CHEST 1 VIEW COMPARISON:  February 20, 2020 FINDINGS: Mild diffusely increased interstitial lung markings are seen. Mild areas of atelectasis and/or infiltrate are noted within the bilateral lung bases. There is no evidence of a pleural effusion or pneumothorax. The heart size and mediastinal contours are within normal limits. There is mild calcification of the aortic arch. The visualized skeletal structures are unremarkable. IMPRESSION: Mild diffusely increased interstitial lung markings with mild bibasilar atelectasis and/or infiltrate. Electronically Signed   By: Aram Candela M.D.   On: 02/28/2020 22:11    Procedures Procedures (including critical care time)  Medications Ordered in ED Medications  cefTRIAXone (ROCEPHIN) 2 g in sodium chloride 0.9 % 100 mL IVPB (0 g  Intravenous Stopped 02/28/20 2329)  azithromycin (ZITHROMAX) 500 mg in sodium chloride 0.9 % 250 mL IVPB (0 mg Intravenous Stopped 02/29/20 0015)  AeroChamber Plus Flo-Vu Large MISC (  Not Given 02/28/20 2326)  donepezil (ARICEPT) tablet 5 mg (has no administration in time range)  DULoxetine (CYMBALTA) DR capsule 60 mg (has no administration in time range)  insulin glargine (LANTUS) injection 40 Units (has no administration in time range)  pregabalin (LYRICA) capsule 100 mg (has no administration in time range)  ondansetron (ZOFRAN) tablet 4 mg (has no administration in time range)    Or  ondansetron (ZOFRAN) injection 4 mg (has no administration in time range)  enoxaparin (LOVENOX) injection 40 mg (40 mg Subcutaneous Given 02/29/20 0901)  ipratropium-albuterol (DUONEB) 0.5-2.5 (3) MG/3ML nebulizer solution 3 mL (3 mLs Nebulization Given 02/29/20 1323)  budesonide (PULMICORT) nebulizer solution 0.25 mg (0.25 mg Nebulization Given 02/29/20 0902)  methylPREDNISolone sodium succinate (SOLU-MEDROL) 125 mg/2 mL injection 60 mg (60 mg Intravenous Given 02/29/20 0900)  insulin glargine (LANTUS) injection  15 Units (15 Units Subcutaneous Given 02/29/20 1317)  magnesium sulfate IVPB 2 g 50 mL (0 g Intravenous Stopped 02/29/20 0016)  methylPREDNISolone sodium succinate (SOLU-MEDROL) 125 mg/2 mL injection 125 mg (125 mg Intravenous Given 02/28/20 2242)  albuterol (VENTOLIN HFA) 108 (90 Base) MCG/ACT inhaler 8 puff (8 puffs Inhalation Given 02/28/20 2325)  ipratropium (ATROVENT HFA) inhaler 4 puff (4 puffs Inhalation Given 02/28/20 2327)  AeroChamber Plus Flo-Vu Large MISC 1 each (1 each Other Given 02/28/20 2325)  lactated ringers bolus 1,000 mL (0 mLs Intravenous Stopped 02/29/20 0016)  lactated ringers bolus 1,500 mL (0 mLs Intravenous Stopped 02/29/20 0212)    ED Course  I have reviewed the triage vital signs and the nursing notes.  Pertinent labs & imaging results that were available during my care of the  patient were reviewed by me and considered in my medical decision making (see chart for details).    MDM Rules/Calculators/A&P                          64 year old female with history of CHF, COPD presents with shortness of breath.  Patient is normally on 3 L of oxygen at home however tonight around 730 she developed shortness of breath as well as generalized weakness.  Family at that time also noted that patient seemed more confused and not herself.  EMS was called and evaluated the patient and she was found to be satting in the mid 80s on home O2.  Patient was uptitrated by EMS to 6 L and was satting 94%.  Blood glucose by EMS 218.  Remainder vital signs stable.  Patient intermittently confused with EMS prior to arrival to the emergency department.  Upon arrival to the ED patient was alert and able to answer basic questions.  She denied at that time any fever, chills, abdominal pain, vomiting, diarrhea, chest pain.  She did endorse a cough and chronic lower extremity swelling.  Patient was placed on 6 L of oxygen was satting above 95%.  Remainder vital signs at that time were stable.  Exam was most notable for wheezes throughout the lungs as well as 2+ pitting edema in the bilateral lower extremities.  We will give patient a breathing treatment as well as magnesium sulfate and Solu-Medrol for presumed COPD exacerbation further work-up for possible heart failure exacerbation or other infectious etiology that may be causing her shortness of breath.  Of note patient has not had Covid and has not been vaccinated.  Shortly after initiation of w/u I was notified by nursing staff that pt was febrile to 101 and had SBP in high 80s.  Given low SBP, fever and elevated RR code sepsis was called and pt was started on fluids and abx for likely CAP source.  BCx and UCx obtained.  Pt given 1L fluid initially w/ plan to reassess after one liter w/ plan to reach 30cc/kg goal.  At time of signout pt's lactate and  remainder of labs and fluids.  Pt was signed out to oncoming provider in stable condition w/o further events.   Final Clinical Impression(s) / ED Diagnoses Final diagnoses:  Sepsis, due to unspecified organism, unspecified whether acute organ dysfunction present [facility])  Community acquired pneumonia, unspecified laterality  Congestive heart failure, unspecified HF chronicity, unspecified heart failure type [facility])    Rx / DC Orders ED Discharge Orders    None       Rickey Primus, MD 02/29/20 [phone]  Gwyneth SproutPlunkett, [place], MD 02/29/20 2045

## 2020-02-28 NOTE — ED Triage Notes (Signed)
Pt BIB [facility] Presenting with SHOB that started tonight around 1930. Pt is normally on 3L O2 at home for hx of COPD/CHF, etc. Upon EMS arrival Pt O2 was in mid 80's, per family Pt's breathing and the way she was acting "was not normal." EMS Placed on 4L, on arrival to ED, Pt noted to be 83% on 4L, O2 placed on 6L sating at 94%. ED MD in room on arrival.  110/70 60HR 28R CBG 218

## 2020-02-28 NOTE — ED Provider Notes (Signed)
  Physical Exam  BP (!) 92/54 (BP Location: Left Arm)   Pulse 61   Temp (!) 101 F (38.3 C) (Rectal)   Resp 20   Ht 5' (1.524 m)   Wt 81.6 kg   LMP 05/03/2012   SpO2 98%   BMI 35.15 kg/m   Physical Exam  Gen: appears ill Pulm: 88% on 4 L via nasal cannula.  Speaking in short sentences.  Tachypneic.  ED Course/Procedures     Procedures  MDM   Pt signed out to me by C Weekly, MD. Please see previous notes for further history.   In brief, patient presenting for evaluation of worsening shortness of breath.  This began today.  He is normally on 3 L of oxygen at home.  She was found to be in the 80s on her home O2, this improved while on 6 L.  She has a history of COPD and CHF.  She was weak and confused with EMS.  She was found to be febrile 101.  Blood pressure soft.  Treatment was started for sepsis including antibiotics.  Patient given a liter of fluids to help support blood pressure, but as she did not at that time the criteria for septic shock, held 30 cc/kg.   Chest x-ray viewed by me, shows bilateral pneumonia.  Also complains of failure.  BNP elevated at 900.  Lactic came back at 5.7, patient is very acidotic with bicarb of 11.  As such, will have patient complete a 30 cc/kg bolus.  However patient does have some mild pitting edema, and there is concern for heart failure and fluid overload.  Last EF was 60%.  On reassessment, patient is tachypneic.  Speaking short sentences.  Sats at 88% on 5 L via nasal cannula.  She will need to be admitted for septic pneumonia, complicated by degree of heart failure.  Will order ABG for further evaluation of acidosis.  Will consult with PCCM, as I am concerned patient may have rapidly worsening respiratory status. Case discussed with attending, Dr. Blinda Leatherwood evaluated the pt.  Discussed with Dr. Arsenio Loader from critical care, his team will evaluate the patient and decide whether patient should be admitted to [facility] or hospitalist service.  Discussed  with Dr. Toniann Fail from [facility] hospitalist service, pt to be admitted.      Alveria Apley, PA-C 02/29/20 7782    Gilda Crease, MD 02/29/20 [phone]

## 2020-02-29 ENCOUNTER — Encounter (HOSPITAL_COMMUNITY): Payer: Self-pay | Admitting: Internal Medicine

## 2020-02-29 DIAGNOSIS — Z9911 Dependence on respirator [ventilator] status: Secondary | ICD-10-CM | POA: Diagnosis not present

## 2020-02-29 DIAGNOSIS — D649 Anemia, unspecified: Secondary | ICD-10-CM | POA: Diagnosis present

## 2020-02-29 DIAGNOSIS — I110 Hypertensive heart disease with heart failure: Secondary | ICD-10-CM | POA: Diagnosis present

## 2020-02-29 DIAGNOSIS — I2781 Cor pulmonale (chronic): Secondary | ICD-10-CM | POA: Diagnosis present

## 2020-02-29 DIAGNOSIS — I50810 Right heart failure, unspecified: Secondary | ICD-10-CM | POA: Diagnosis present

## 2020-02-29 DIAGNOSIS — I5033 Acute on chronic diastolic (congestive) heart failure: Secondary | ICD-10-CM | POA: Diagnosis present

## 2020-02-29 DIAGNOSIS — N179 Acute kidney failure, unspecified: Secondary | ICD-10-CM | POA: Diagnosis present

## 2020-02-29 DIAGNOSIS — G931 Anoxic brain damage, not elsewhere classified: Secondary | ICD-10-CM | POA: Diagnosis not present

## 2020-02-29 DIAGNOSIS — R6520 Severe sepsis without septic shock: Secondary | ICD-10-CM | POA: Diagnosis present

## 2020-02-29 DIAGNOSIS — J9601 Acute respiratory failure with hypoxia: Secondary | ICD-10-CM | POA: Diagnosis present

## 2020-02-29 DIAGNOSIS — Z20822 Contact with and (suspected) exposure to covid-19: Secondary | ICD-10-CM | POA: Diagnosis present

## 2020-02-29 DIAGNOSIS — A403 Sepsis due to Streptococcus pneumoniae: Secondary | ICD-10-CM | POA: Diagnosis present

## 2020-02-29 DIAGNOSIS — Z9981 Dependence on supplemental oxygen: Secondary | ICD-10-CM | POA: Diagnosis not present

## 2020-02-29 DIAGNOSIS — I2721 Secondary pulmonary arterial hypertension: Secondary | ICD-10-CM | POA: Diagnosis present

## 2020-02-29 DIAGNOSIS — E669 Obesity, unspecified: Secondary | ICD-10-CM | POA: Diagnosis present

## 2020-02-29 DIAGNOSIS — Z87891 Personal history of nicotine dependence: Secondary | ICD-10-CM | POA: Diagnosis not present

## 2020-02-29 DIAGNOSIS — G9341 Metabolic encephalopathy: Secondary | ICD-10-CM | POA: Diagnosis present

## 2020-02-29 DIAGNOSIS — F0390 Unspecified dementia without behavioral disturbance: Secondary | ICD-10-CM | POA: Diagnosis present

## 2020-02-29 DIAGNOSIS — E1165 Type 2 diabetes mellitus with hyperglycemia: Secondary | ICD-10-CM | POA: Diagnosis present

## 2020-02-29 DIAGNOSIS — E872 Acidosis: Secondary | ICD-10-CM | POA: Diagnosis present

## 2020-02-29 DIAGNOSIS — J189 Pneumonia, unspecified organism: Secondary | ICD-10-CM | POA: Diagnosis not present

## 2020-02-29 DIAGNOSIS — I469 Cardiac arrest, cause unspecified: Secondary | ICD-10-CM | POA: Diagnosis not present

## 2020-02-29 DIAGNOSIS — R579 Shock, unspecified: Secondary | ICD-10-CM | POA: Diagnosis not present

## 2020-02-29 DIAGNOSIS — J441 Chronic obstructive pulmonary disease with (acute) exacerbation: Secondary | ICD-10-CM | POA: Diagnosis present

## 2020-02-29 DIAGNOSIS — Z8249 Family history of ischemic heart disease and other diseases of the circulatory system: Secondary | ICD-10-CM | POA: Diagnosis not present

## 2020-02-29 DIAGNOSIS — H548 Legal blindness, as defined in USA: Secondary | ICD-10-CM | POA: Diagnosis present

## 2020-02-29 DIAGNOSIS — R7989 Other specified abnormal findings of blood chemistry: Secondary | ICD-10-CM | POA: Diagnosis not present

## 2020-02-29 DIAGNOSIS — J154 Pneumonia due to other streptococci: Secondary | ICD-10-CM | POA: Diagnosis present

## 2020-02-29 DIAGNOSIS — J9621 Acute and chronic respiratory failure with hypoxia: Secondary | ICD-10-CM | POA: Diagnosis present

## 2020-02-29 DIAGNOSIS — J440 Chronic obstructive pulmonary disease with acute lower respiratory infection: Secondary | ICD-10-CM | POA: Diagnosis present

## 2020-02-29 DIAGNOSIS — A419 Sepsis, unspecified organism: Secondary | ICD-10-CM | POA: Diagnosis present

## 2020-02-29 DIAGNOSIS — Z6835 Body mass index (BMI) 35.0-35.9, adult: Secondary | ICD-10-CM | POA: Diagnosis not present

## 2020-02-29 DIAGNOSIS — J9602 Acute respiratory failure with hypercapnia: Secondary | ICD-10-CM | POA: Diagnosis not present

## 2020-02-29 DIAGNOSIS — J9622 Acute and chronic respiratory failure with hypercapnia: Secondary | ICD-10-CM | POA: Diagnosis not present

## 2020-02-29 LAB — CBC WITH DIFFERENTIAL/PLATELET
Abs Immature Granulocytes: 0.09 10*3/uL — ABNORMAL HIGH (ref 0.00–0.07)
Basophils Absolute: 0 10*3/uL (ref 0.0–0.1)
Basophils Relative: 0 %
Eosinophils Absolute: 0 10*3/uL (ref 0.0–0.5)
Eosinophils Relative: 0 %
HCT: 40.3 % (ref 36.0–46.0)
Hemoglobin: 11.1 g/dL — ABNORMAL LOW (ref 12.0–15.0)
Immature Granulocytes: 1 %
Lymphocytes Relative: 8 %
Lymphs Abs: 0.7 10*3/uL (ref 0.7–4.0)
MCH: 22.3 pg — ABNORMAL LOW (ref 26.0–34.0)
MCHC: 27.5 g/dL — ABNORMAL LOW (ref 30.0–36.0)
MCV: 80.9 fL (ref 80.0–100.0)
Monocytes Absolute: 0.1 10*3/uL (ref 0.1–1.0)
Monocytes Relative: 1 %
Neutro Abs: 7.9 10*3/uL — ABNORMAL HIGH (ref 1.7–7.7)
Neutrophils Relative %: 90 %
Platelets: 320 10*3/uL (ref 150–400)
RBC: 4.98 MIL/uL (ref 3.87–5.11)
RDW: 22.4 % — ABNORMAL HIGH (ref 11.5–15.5)
WBC: 8.8 10*3/uL (ref 4.0–10.5)
nRBC: 0 % (ref 0.0–0.2)

## 2020-02-29 LAB — BASIC METABOLIC PANEL
Anion gap: 15 (ref 5–15)
BUN: 23 mg/dL (ref 8–23)
CO2: 16 mmol/L — ABNORMAL LOW (ref 22–32)
Calcium: 8.4 mg/dL — ABNORMAL LOW (ref 8.9–10.3)
Chloride: 107 mmol/L (ref 98–111)
Creatinine, Ser: 1.77 mg/dL — ABNORMAL HIGH (ref 0.44–1.00)
GFR calc Af Amer: 34 mL/min — ABNORMAL LOW (ref >60)
GFR calc non Af Amer: 30 mL/min — ABNORMAL LOW (ref >60)
Glucose, Bld: 201 mg/dL — ABNORMAL HIGH (ref 70–99)
Potassium: 4.8 mmol/L (ref 3.5–5.1)
Sodium: 138 mmol/L (ref 135–145)

## 2020-02-29 LAB — I-STAT ARTERIAL BLOOD GAS, ED
Acid-base deficit: 9 mmol/L — ABNORMAL HIGH (ref 0.0–2.0)
Bicarbonate: 15.7 mmol/L — ABNORMAL LOW (ref 20.0–28.0)
Calcium, Ion: 1.19 mmol/L (ref 1.15–1.40)
HCT: 35 % — ABNORMAL LOW (ref 36.0–46.0)
Hemoglobin: 11.9 g/dL — ABNORMAL LOW (ref 12.0–15.0)
O2 Saturation: 89 %
Patient temperature: 99.1
Potassium: 4.5 mmol/L (ref 3.5–5.1)
Sodium: 138 mmol/L (ref 135–145)
TCO2: 17 mmol/L — ABNORMAL LOW (ref 22–32)
pCO2 arterial: 29.8 mmHg — ABNORMAL LOW (ref 32.0–48.0)
pH, Arterial: 7.33 — ABNORMAL LOW (ref 7.350–7.450)
pO2, Arterial: 60 mmHg — ABNORMAL LOW (ref 83.0–108.0)

## 2020-02-29 LAB — URINALYSIS, ROUTINE W REFLEX MICROSCOPIC
Bacteria, UA: NONE SEEN
Bilirubin Urine: NEGATIVE
Glucose, UA: NEGATIVE mg/dL
Hgb urine dipstick: NEGATIVE
Ketones, ur: 5 mg/dL — AB
Leukocytes,Ua: NEGATIVE
Nitrite: NEGATIVE
Protein, ur: 30 mg/dL — AB
Specific Gravity, Urine: 1.023 (ref 1.005–1.030)
pH: 5 (ref 5.0–8.0)

## 2020-02-29 LAB — COMPREHENSIVE METABOLIC PANEL
ALT: UNDETERMINED U/L (ref 0–44)
AST: UNDETERMINED U/L (ref 15–41)
Albumin: 2.3 g/dL — ABNORMAL LOW (ref 3.5–5.0)
Alkaline Phosphatase: UNDETERMINED U/L (ref 38–126)
Anion gap: 16 — ABNORMAL HIGH (ref 5–15)
BUN: 26 mg/dL — ABNORMAL HIGH (ref 8–23)
CO2: 12 mmol/L — ABNORMAL LOW (ref 22–32)
Calcium: 8.1 mg/dL — ABNORMAL LOW (ref 8.9–10.3)
Chloride: 108 mmol/L (ref 98–111)
Creatinine, Ser: 1.5 mg/dL — ABNORMAL HIGH (ref 0.44–1.00)
GFR calc Af Amer: 42 mL/min — ABNORMAL LOW (ref >60)
GFR calc non Af Amer: 36 mL/min — ABNORMAL LOW (ref >60)
Glucose, Bld: 223 mg/dL — ABNORMAL HIGH (ref 70–99)
Potassium: 4.6 mmol/L (ref 3.5–5.1)
Sodium: 136 mmol/L (ref 135–145)
Total Bilirubin: UNDETERMINED mg/dL (ref 0.3–1.2)
Total Protein: 5.9 g/dL — ABNORMAL LOW (ref 6.5–8.1)

## 2020-02-29 LAB — LACTIC ACID, PLASMA
Lactic Acid, Venous: 5.7 mmol/L (ref 0.5–1.9)
Lactic Acid, Venous: 2 mmol/L (ref 0.5–1.9)

## 2020-02-29 LAB — ALKALINE PHOSPHATASE: Alkaline Phosphatase: 124 U/L (ref 38–126)

## 2020-02-29 LAB — GLUCOSE, CAPILLARY: Glucose-Capillary: 449 mg/dL — ABNORMAL HIGH (ref 70–99)

## 2020-02-29 LAB — CBG MONITORING, ED
Glucose-Capillary: 263 mg/dL — ABNORMAL HIGH (ref 70–99)
Glucose-Capillary: 398 mg/dL — ABNORMAL HIGH (ref 70–99)

## 2020-02-29 LAB — TROPONIN I (HIGH SENSITIVITY): Troponin I (High Sensitivity): 43 ng/L — ABNORMAL HIGH (ref <18)

## 2020-02-29 LAB — ALT: ALT: 40 U/L (ref 0–44)

## 2020-02-29 LAB — APTT: aPTT: 26 seconds (ref 24–36)

## 2020-02-29 LAB — PROCALCITONIN: Procalcitonin: 0.18 ng/mL

## 2020-02-29 LAB — STREP PNEUMONIAE URINARY ANTIGEN: Strep Pneumo Urinary Antigen: POSITIVE — AB

## 2020-02-29 LAB — BILIRUBIN, TOTAL: Total Bilirubin: 0.5 mg/dL (ref 0.3–1.2)

## 2020-02-29 LAB — PROTIME-INR
INR: 1.2 (ref 0.8–1.2)
Prothrombin Time: 14.2 seconds (ref 11.4–15.2)

## 2020-02-29 LAB — AST: AST: 27 U/L (ref 15–41)

## 2020-02-29 MED ORDER — ENOXAPARIN SODIUM 40 MG/0.4ML ~~LOC~~ SOLN
40.0000 mg | SUBCUTANEOUS | Status: DC
  Administered 2020-02-29 – 2020-03-02 (×3): 40 mg via SUBCUTANEOUS
  Filled 2020-02-29 (×4): qty 0.4

## 2020-02-29 MED ORDER — ONDANSETRON HCL 4 MG PO TABS: 4.0000 mg | ORAL_TABLET | Freq: Four times a day (QID) | ORAL | Status: DC | PRN

## 2020-02-29 MED ORDER — ONDANSETRON HCL 4 MG/2ML IJ SOLN
4.0000 mg | Freq: Four times a day (QID) | INTRAMUSCULAR | Status: DC | PRN
  Filled 2020-02-29: qty 2

## 2020-02-29 MED ORDER — BUDESONIDE 0.25 MG/2ML IN SUSP
0.2500 mg | Freq: Two times a day (BID) | RESPIRATORY_TRACT | Status: DC
  Administered 2020-02-29 – 2020-03-03 (×7): 0.25 mg via RESPIRATORY_TRACT
  Filled 2020-02-29 (×7): qty 2

## 2020-02-29 MED ORDER — DONEPEZIL HCL 5 MG PO TABS
5.0000 mg | ORAL_TABLET | Freq: Every day | ORAL | Status: DC
  Administered 2020-02-29 – 2020-03-02 (×3): 5 mg via ORAL
  Filled 2020-02-29 (×4): qty 1

## 2020-02-29 MED ORDER — PREGABALIN 100 MG PO CAPS
100.0000 mg | ORAL_CAPSULE | Freq: Three times a day (TID) | ORAL | Status: DC
  Administered 2020-03-01: 100 mg via ORAL
  Filled 2020-02-29 (×2): qty 1

## 2020-02-29 MED ORDER — DULOXETINE HCL 60 MG PO CPEP
60.0000 mg | ORAL_CAPSULE | Freq: Every day | ORAL | Status: DC
  Administered 2020-02-29 – 2020-03-02 (×3): 60 mg via ORAL
  Filled 2020-02-29 (×4): qty 1

## 2020-02-29 MED ORDER — IPRATROPIUM-ALBUTEROL 0.5-2.5 (3) MG/3ML IN SOLN
3.0000 mL | RESPIRATORY_TRACT | Status: DC
  Administered 2020-02-29 (×5): 3 mL via RESPIRATORY_TRACT
  Filled 2020-02-29 (×4): qty 3

## 2020-02-29 MED ORDER — INSULIN GLARGINE 100 UNIT/ML ~~LOC~~ SOLN
15.0000 [IU] | Freq: Every day | SUBCUTANEOUS | Status: DC
  Administered 2020-02-29: 15 [IU] via SUBCUTANEOUS
  Filled 2020-02-29 (×2): qty 0.15

## 2020-02-29 MED ORDER — INSULIN GLARGINE 100 UNIT/ML ~~LOC~~ SOLN
40.0000 [IU] | Freq: Every day | SUBCUTANEOUS | Status: DC
  Administered 2020-02-29 – 2020-03-02 (×3): 40 [IU] via SUBCUTANEOUS
  Filled 2020-02-29 (×4): qty 0.4

## 2020-02-29 MED ORDER — METHYLPREDNISOLONE SODIUM SUCC 125 MG IJ SOLR
60.0000 mg | Freq: Two times a day (BID) | INTRAMUSCULAR | Status: DC
  Administered 2020-02-29 – 2020-03-01 (×3): 60 mg via INTRAVENOUS
  Filled 2020-02-29 (×3): qty 2

## 2020-02-29 NOTE — H&P (Signed)
History and Physical    Kristen SladeGracie Stamper ZOX:[id number] DOB: 1955-07-16 DOA: 02/28/2020  PCP: Claudean SeveranceBradley, Betty, MD  Patient coming from: Home.  History obtained from patient's sister.  Patient has dementia.  Chief Complaint: Shortness of breath and confusion.  HPI: Kristen Dennis is a 64 y.o. female with history of COPD, dementia, blindness in both eyes, CHF, hypertension, diabetes mellitus type 2 was recently admitted last month for respiratory failure likely from a combination of CHF and COPD has been found to be increasingly short of breath over the last 2 weeks.  [place] as per the sister patient was prescribed antibiotics by the local doctor.  Since then patient has been having constant productive cough had gone to her PCP last week and was given another cough syrup despite taking which patient was still coughing a lot.  Last night patient became acutely short of breath and confusion was brought to the ER.  ED Course: In the ER patient became hypotensive with lactic acid of 5.7 fever of 101 chest x-ray showing features concerning for pneumonia.  Labs are showing WBC 11.5 ABG of 7.33 PCO2 of 29 PO2 of 80.  Patient is usually on 3 L oxygen at home presently is on 6 L.  Critical care was consulted at this time patient is being admitted to stepdown unit under hospitalist service for sepsis and respiratory failure secondary to pneumonia.  Covid test is negative.  Review of Systems: As per HPI, rest all negative.   Past Medical History:  Diagnosis Date  . Asthma   . Diabetes mellitus without complication (HCC)   . Encephalitis   . Hypertension   . Legally blind   . Renal disease   . Stroke [facility](HCC)    2011    Past Surgical History:  Procedure Laterality Date  . CHOLECYSTECTOMY       reports that she has quit smoking. She has never used smokeless tobacco. She reports that she does not drink alcohol and does not use drugs.  No Known Allergies  Family  History  Problem Relation Age of Onset  . Cancer Mother   . Heart failure Father     Prior to Admission medications   Medication Sig Start Date End Date Taking? Authorizing Provider  albuterol (PROVENTIL HFA;VENTOLIN HFA) 108 (90 Base) MCG/ACT inhaler Inhale 2 puffs into the lungs every 6 (six) hours as needed for wheezing or shortness of breath. 07/18/17   Maxie BarbBhandari, Dron Prasad, MD  amLODipine (NORVASC) 5 MG tablet Take 5 mg by mouth daily. 05/16/17   [provider]  donepezil (ARICEPT) 5 MG tablet Take 5 mg by mouth at bedtime. 05/16/17   [provider]  DULoxetine (CYMBALTA) 60 MG capsule Take 60 mg by mouth at bedtime. 03/14/17   [provider]  ergocalciferol (VITAMIN D2) 1.25 MG (50000 UT) capsule Take 1 capsule by mouth once a week. 01/09/20   [provider]  fluticasone furoate-vilanterol (BREO ELLIPTA) 100-25 MCG/INH AEPB Inhale 1 puff into the lungs daily. 12/29/19   [provider]  furosemide (LASIX) 40 MG tablet Take 40 mg by mouth daily.    [provider]  hydrALAZINE (APRESOLINE) 50 MG tablet Take 50 mg by mouth 3 (three) times daily. 07/10/17   [provider]  insulin glargine (LANTUS SOLOSTAR) 100 UNIT/ML Solostar Pen Inject 50 Units into the skin at bedtime.  06/03/19   [provider]  lisinopril (PRINIVIL,ZESTRIL) 10 MG tablet Take 10  mg by mouth every evening.  07/10/17   [provider]  metFORMIN (GLUCOPHAGE) 500 MG tablet Take 1,000 mg by mouth 2 (two) times daily. 05/18/17   [provider]  metoprolol tartrate (LOPRESSOR) 50 MG tablet Take 50 mg by mouth 2 (two) times daily. 07/10/17   [provider]  montelukast (SINGULAIR) 10 MG tablet Take 10 mg by mouth at bedtime. 07/10/17   [provider]  omeprazole (PRILOSEC) 20 MG capsule Take 20 mg by mouth every evening.  04/14/17   [provider]  potassium chloride SA (K-DUR,KLOR-CON) 20 MEQ tablet Take 20 mEq  by mouth daily.    [provider]  predniSONE (DELTASONE) 10 MG tablet 4 tablet daily  for 3 days 3 tablet daily for 3 days 2 tablet daily for 3 days 1 tablet daily for 3 days 01/22/20   Dorcas Carrow, MD  pregabalin (LYRICA) 100 MG capsule Take 1 capsule (100 mg total) by mouth 3 (three) times daily. 09/22/17   Fayrene Helper, PA-C  sitaGLIPtin (JANUVIA) 25 MG tablet Take 1 tablet by mouth daily. 12/24/18   [provider]    Physical Exam: Constitutional: Moderately built and nourished. Vitals:   02/28/20 2345 02/29/20 0239 02/29/20 0300 02/29/20 0415  BP: (!) 110/59  134/65 118/65  Pulse: 65  63 64  Resp: (!) 24  18 14   Temp:  98.5 F (36.9 C)    TempSrc:  Rectal    SpO2: 91%  92% 95%  Weight:      Height:       Eyes: Anicteric no pallor. ENMT: No discharge from the ears eyes nose or mouth. Neck: No mass felt.  No neck rigidity. Respiratory: No rhonchi or crepitations. Cardiovascular: S1-S2 heard. Abdomen: Soft nontender bowel sounds present. Musculoskeletal: Mild edema. Skin: No rash. Neurologic: Alert awake appears confused.  Moving all extremities. Psychiatric: Appears confused.   Labs on Admission: I have personally reviewed following labs and imaging studies  CBC: Recent Labs  Lab 02/28/20 2200 02/29/20 0211  WBC 11.5*  --   NEUTROABS 9.4*  --   HGB 11.2* 11.9*  HCT 39.1 35.0*  MCV 78.0*  --   PLT 437*  --    Basic Metabolic Panel: Recent Labs  Lab 02/28/20 2200 02/28/20 2316 02/29/20 0158 02/29/20 0211  NA 136  --  138 138  K 5.8*  --  4.8 4.5  CL 109  --  107  --   CO2 11*  --  16*  --   GLUCOSE 183*  --  201*  --   BUN 21  --  23  --   CREATININE 1.82* 2.00* 1.77*  --   CALCIUM 8.1*  --  8.4*  --    GFR: Estimated Creatinine Clearance: 30 mL/min (A) (by C-G formula based on SCr of 1.77 mg/dL (H)). Liver Function Tests: No results for input(s): AST, ALT, ALKPHOS, BILITOT, PROT, ALBUMIN in the last 168 hours. No results for  input(s): LIPASE, AMYLASE in the last 168 hours. No results for input(s): AMMONIA in the last 168 hours. Coagulation Profile: No results for input(s): INR, PROTIME in the last 168 hours. Cardiac Enzymes: No results for input(s): CKTOTAL, CKMB, CKMBINDEX, TROPONINI in the last 168 hours. BNP (last 3 results) No results for input(s): PROBNP in the last 8760 hours. HbA1C: No results for input(s): HGBA1C in the last 72 hours. CBG: No results for input(s): GLUCAP in the last 168 hours. Lipid Profile: No results for input(s):  CHOL, HDL, LDLCALC, TRIG, CHOLHDL, LDLDIRECT in the last 72 hours. Thyroid Function Tests: No results for input(s): TSH, T4TOTAL, FREET4, T3FREE, THYROIDAB in the last 72 hours. Anemia Panel: No results for input(s): VITAMINB12, FOLATE, FERRITIN, TIBC, IRON, RETICCTPCT in the last 72 hours. Urine analysis: No results found for: COLORURINE, APPEARANCEUR, LABSPEC, PHURINE, GLUCOSEU, HGBUR, BILIRUBINUR, KETONESUR, PROTEINUR, UROBILINOGEN, NITRITE, LEUKOCYTESUR Sepsis Labs: @LABRCNTIP (procalcitonin:4,lacticidven:4) ) Recent Results (from the past 240 hour(s))  SARS Coronavirus 2 by RT PCR (hospital order, performed in [facility] hospital lab) Nasopharyngeal Nasopharyngeal Swab     Status: None   Collection Time: 02/28/20  9:53 PM   Specimen: Nasopharyngeal Swab  Result Value Ref Range Status   SARS Coronavirus 2 NEGATIVE NEGATIVE Final    Comment: (NOTE) SARS-CoV-2 target nucleic acids are NOT DETECTED.  The SARS-CoV-2 RNA is generally detectable in upper and lower respiratory specimens during the acute phase of infection. The lowest concentration of SARS-CoV-2 viral copies this assay can detect is 250 copies / mL. A negative result does not preclude SARS-CoV-2 infection and should not be used as the sole basis for treatment or other patient management decisions.  A negative result may occur with improper specimen collection / handling, submission of specimen  other than nasopharyngeal swab, presence of viral mutation(s) within the areas targeted by this assay, and inadequate number of viral copies (<250 copies / mL). A negative result must be combined with clinical observations, patient history, and epidemiological information.  Fact Sheet for Patients:   03/01/20  Fact Sheet for Healthcare Providers: BoilerBrush.com.cy  This test is not yet approved or  cleared by the https://pope.com/ FDA and has been authorized for detection and/or diagnosis of SARS-CoV-2 by FDA under an Emergency Use Authorization (EUA).  This EUA will remain in effect (meaning this test can be used) for the duration of the COVID-19 declaration under Section 564(b)(1) of the Act, 21 U.S.C. section 360bbb-3(b)(1), unless the authorization is terminated or revoked sooner.  Performed at [facility], [address], [place], [place]      Radiological Exams on Admission: DG Chest Portable 1 View  Result Date: 02/28/2020 CLINICAL DATA:  Shortness of breath. EXAM: PORTABLE CHEST 1 VIEW COMPARISON:  February 20, 2020 FINDINGS: Mild diffusely increased interstitial lung markings are seen. Mild areas of atelectasis and/or infiltrate are noted within the bilateral lung bases. There is no evidence of a pleural effusion or pneumothorax. The heart size and mediastinal contours are within normal limits. There is mild calcification of the aortic arch. The visualized skeletal structures are unremarkable. IMPRESSION: Mild diffusely increased interstitial lung markings with mild bibasilar atelectasis and/or infiltrate. Electronically Signed   By: February 22, 2020 M.D.   On: 02/28/2020 22:11    EKG: Independently reviewed.  Normal sinus rhythm.  Assessment/Plan Principal Problem:   Acute respiratory failure with hypoxia (HCC) Active Problems:   COPD (chronic obstructive pulmonary disease) (HCC)   Dementia (HCC)    ARF (acute renal failure) (HCC)   CAP (community acquired pneumonia)   Sepsis (HCC)    1. Acute respiratory failure with hypoxia with sepsis secondary to pneumonia for which patient is on empiric antibiotics fluids follow lactic acid blood cultures procalcitonin level. 2. Acute encephalopathy likely from sepsis closely monitor mental status.  3. Acute renal failure could be from sepsis.  Holding antihypertensive including lisinopril.  Hydrate and follow metabolic panel. 4. Diabetes mellitus type 2 on Lantus insulin dose of which I am decreasing due to renal failure.  Patient takes 50 units, decreasing to 40.  Sliding scale coverage. 5. Hypertension since patient is septic and hypotensive holding antihypertensives for now. 6. Anemia follow CBC appears to be chronic. 7. History of dementia on Aricept. 8. Blindness in both eyes. 9. COPD continue with nebulizer Pulmicort patient is wheezing.  We will keep patient on steroids.  Since patient is having acute respiratory failure will need close monitoring for any further worsening in inpatient status.   DVT prophylaxis: Lovenox. Code Status: Full code. Family Communication: Patient's sister. Disposition Plan: Home. Consults called: Pulmonary critical care. Admission status: Inpatient.   Eduard Clos MD [facility] Pager [phone].  If 7PM-7AM, please contact night-coverage [email or web address] Password [facility]  02/29/2020, 5:36 AM

## 2020-02-29 NOTE — ED Notes (Signed)
Pt's o2 reduced to 2L, which is her home O2 level.

## 2020-02-29 NOTE — Progress Notes (Signed)
Patient seen and examined, admitted earlier this morning by Dr. Toniann Fail, Ms. Gragert is a 64 year old chronically ill female with history of COPD/chronic resp fai;ure onO2-3L,  Chronic diastolic CHF RV failure, dementia, blindness, lives at home with her sister was brought to the ED with progressive shortness of breath for 2 weeks. -In the ED she was initially hypotensive, resolved with gentle IV fluids,, chest x-ray noted interstitial findings concerning for possible pneumonia, temp was 101, lactic acid was 5.7. - Sepsis Acute on chronic hypoxic respiratory failure Community-acquired versus aspiration pneumonia COPD exacerbation -Clinically improving, blood pressure has stabilized -Continue IV ceftriaxone and azithromycin, follow-up blood cultures -Check SLP evaluation -Clinically also appears mildly fluid overloaded however given soft blood pressures hold off on diuretics today, hold additional IV fluids -Continue duo nebs, mild wheezing noted, add IV Solu-Medrol  Acute on chronic diastolic CHF Moderate pulmonary hypertension RV failure/cor pulmonale -Soft BP limits diuretics at this time, stop additional IV fluids -Holding antihypertensives and diuretics -Resume as blood pressure tolerates  Acute renal failure -In the setting of sepsis and ACE inhibitor -Creatinine 2.0 on admission, baseline is 1.2 -Improving, hold lisinopril  Type 2 diabetes mellitus -Continue Lantus, add meal coverage NovoLog -Monitor sugars on steroids  Dementia Blindness  Zannie Cove, MD

## 2020-02-29 NOTE — ED Notes (Signed)
Pt placed back on 6L [place]. When she fell asleep her sats dropped again to 86%.

## 2020-02-29 NOTE — Consult Note (Addendum)
NAMEJya Dennis, MRN:  [id number], DOB:  [birth year], LOS: 0 ADMISSION DATE:  02/28/2020, CONSULTATION DATE:  02/29/20 REFERRING MD:  ED, CHIEF COMPLAINT:  Dyspnea  Brief History   Kristen Dennis is a 64 year old woman with a history of COPD on 3L o2 at home, evidence of Pulmonary HTN and RV failure on prior echo, dementia, blindness, presented with 2 weeks SOB.  Cough.   History of present illness   Shortness of breath and cough x several weeks.  Hx from chart, patient not providing much hx to me, awake and alert but only giving short answers.   Was prescribed antibiotics and cough syrup last week.   Was hypotensive in ED, resolved with gentle fluid.    Now BP nl range, sat 92% on 6l  nasal cannula.   Past Medical History    Asthma   . Diabetes mellitus without complication (HCC)   . Encephalitis   . Hypertension   . Legally blind   . Renal disease   . Stroke [facility])    2011     Significant Hospital Events     Consults:    Procedures:    Significant Diagnostic Tests:  cxr IMPRESSION: Mild diffusely increased interstitial lung markings with mild bibasilar atelectasis and/or infiltrate. Micro Data:    Antimicrobials:  Ceftriaxone, azithro  Interim history/subjective:  Feels SOB  Objective   Blood pressure 134/65, pulse 63, temperature 98.5 F (36.9 C), temperature source Rectal, resp. rate 18, height 5' (1.524 m), weight 81.6 kg, last menstrual period 05/03/2012, SpO2 92 %.        Intake/Output Summary (Last 24 hours) at 02/29/2020 4098 Last data filed at 02/29/2020 0212 Gross per 24 hour  Intake 1600 ml  Output --  Net 1600 ml   Filed Weights   02/28/20 2145  Weight: 81.6 kg    Examination: General: fatigued, RR 22 HENT: ncat, blindness B Lungs: B wheezes and crackles Cardiovascular: rrr  Abdomen: nt, nd, nbs Extremities: mild edema in BLE Neuro: sleepy, but easily arousable and pleasant   Resolved Hospital Problem list     Assessment  & Plan:  Pneumonia, community acquired.  COPD exacerbation Agree with ceftriax/azith, systemic steroids.  Duonebs pending  Covid negative.  Goal sat 88-92% Could be monitored on tele floor with continuous O2 sat monitor or step down.  Pulmonary HTN likely contributing to chronic component hyoxemia. Agree with being cautious with fluids. Consider repeat Echo now.   Hypotension: resolved.  Follow up repeat lactate, WBC.    Patient seems appropriate for tele vs step down.  Please call with further questions.  Thank you      Labs   CBC: Recent Labs  Lab 02/28/20 2200 02/29/20 0211  WBC 11.5*  --   NEUTROABS 9.4*  --   HGB 11.2* 11.9*  HCT 39.1 35.0*  MCV 78.0*  --   PLT 437*  --     Basic Metabolic Panel: Recent Labs  Lab 02/28/20 2200 02/28/20 2316 02/29/20 0158 02/29/20 0211  NA 136  --  138 138  K 5.8*  --  4.8 4.5  CL 109  --  107  --   CO2 11*  --  16*  --   GLUCOSE 183*  --  201*  --   BUN 21  --  23  --   CREATININE 1.82* 2.00* 1.77*  --   CALCIUM 8.1*  --  8.4*  --    GFR: Estimated Creatinine Clearance:  30 mL/min (A) (by C-G formula based on SCr of 1.77 mg/dL (H)). Recent Labs  Lab 02/28/20 2200 02/29/20 0037  WBC 11.5*  --   LATICACIDVEN 5.7* 5.7*    Liver Function Tests: No results for input(s): AST, ALT, ALKPHOS, BILITOT, PROT, ALBUMIN in the last 168 hours. No results for input(s): LIPASE, AMYLASE in the last 168 hours. No results for input(s): AMMONIA in the last 168 hours.  ABG    Component Value Date/Time   PHART 7.330 (L) 02/29/2020 0211   PCO2ART 29.8 (L) 02/29/2020 0211   PO2ART 60 (L) 02/29/2020 0211   HCO3 15.7 (L) 02/29/2020 0211   TCO2 17 (L) 02/29/2020 0211   ACIDBASEDEF 9.0 (H) 02/29/2020 0211   O2SAT 89.0 02/29/2020 0211     Coagulation Profile: No results for input(s): INR, PROTIME in the last 168 hours.  Cardiac Enzymes: No results for input(s): CKTOTAL, CKMB, CKMBINDEX, TROPONINI in the last 168  hours.  HbA1C: Hgb A1c MFr Bld  Date/Time Value Ref Range Status  01/19/2020 02:25 AM 5.7 (H) 4.8 - 5.6 % Final    Comment:    (NOTE) Pre diabetes:          5.7%-6.4%  Diabetes:              >6.4%  Glycemic control for   <7.0% adults with diabetes     CBG: No results for input(s): GLUCAP in the last 168 hours.  Review of Systems:   Unable to assess due to patient cooperation  Past Medical History  She,  has a past medical history of Asthma, Diabetes mellitus without complication (HCC), Encephalitis, Hypertension, Legally blind, Renal disease, and Stroke (HCC).   Surgical History    Past Surgical History:  Procedure Laterality Date  . CHOLECYSTECTOMY       Social History   reports that she has quit smoking. She has never used smokeless tobacco. She reports that she does not drink alcohol and does not use drugs.   Family History   Her family history includes Cancer in her mother; Heart failure in her father.   Allergies No Known Allergies   Home Medications  Prior to Admission medications   Medication Sig Start Date End Date Taking? Authorizing Provider  albuterol (PROVENTIL HFA;VENTOLIN HFA) 108 (90 Base) MCG/ACT inhaler Inhale 2 puffs into the lungs every 6 (six) hours as needed for wheezing or shortness of breath. 07/18/17   Maxie Barb, MD  amLODipine (NORVASC) 5 MG tablet Take 5 mg by mouth daily. 05/16/17   [provider]  donepezil (ARICEPT) 5 MG tablet Take 5 mg by mouth at bedtime. 05/16/17   [provider]  DULoxetine (CYMBALTA) 60 MG capsule Take 60 mg by mouth at bedtime. 03/14/17   [provider]  ergocalciferol (VITAMIN D2) 1.25 MG (50000 UT) capsule Take 1 capsule by mouth once a week. 01/09/20   [provider]  fluticasone furoate-vilanterol (BREO ELLIPTA) 100-25 MCG/INH AEPB Inhale 1 puff into the lungs daily. 12/29/19   [provider]  furosemide (LASIX) 40 MG tablet Take 40 mg by mouth daily.     [provider]  hydrALAZINE (APRESOLINE) 50 MG tablet Take 50 mg by mouth 3 (three) times daily. 07/10/17   [provider]  insulin glargine (LANTUS SOLOSTAR) 100 UNIT/ML Solostar Pen Inject 50 Units into the skin at bedtime.  06/03/19   [provider]  lisinopril (PRINIVIL,ZESTRIL) 10 MG tablet Take 10 mg by mouth every evening.  07/10/17  [provider]  metFORMIN (GLUCOPHAGE) 500 MG tablet Take 1,000 mg by mouth 2 (two) times daily. 05/18/17   [provider]  metoprolol tartrate (LOPRESSOR) 50 MG tablet Take 50 mg by mouth 2 (two) times daily. 07/10/17   [provider]  montelukast (SINGULAIR) 10 MG tablet Take 10 mg by mouth at bedtime. 07/10/17   [provider]  omeprazole (PRILOSEC) 20 MG capsule Take 20 mg by mouth every evening.  04/14/17   [provider]  potassium chloride SA (K-DUR,KLOR-CON) 20 MEQ tablet Take 20 mEq by mouth daily.    [provider]  predniSONE (DELTASONE) 10 MG tablet 4 tablet daily  for 3 days 3 tablet daily for 3 days 2 tablet daily for 3 days 1 tablet daily for 3 days 01/22/20   Dorcas Carrow, MD  pregabalin (LYRICA) 100 MG capsule Take 1 capsule (100 mg total) by mouth 3 (three) times daily. 09/22/17   Fayrene Helper, PA-C  sitaGLIPtin (JANUVIA) 25 MG tablet Take 1 tablet by mouth daily. 12/24/18   [provider]     Critical care time: 35 min

## 2020-03-01 ENCOUNTER — Inpatient Hospital Stay (HOSPITAL_COMMUNITY): Payer: Medicare PPO

## 2020-03-01 DIAGNOSIS — J9601 Acute respiratory failure with hypoxia: Secondary | ICD-10-CM

## 2020-03-01 DIAGNOSIS — J189 Pneumonia, unspecified organism: Secondary | ICD-10-CM

## 2020-03-01 LAB — COMPREHENSIVE METABOLIC PANEL
ALT: 33 U/L (ref 0–44)
AST: 21 U/L (ref 15–41)
Albumin: 2.3 g/dL — ABNORMAL LOW (ref 3.5–5.0)
Alkaline Phosphatase: 114 U/L (ref 38–126)
Anion gap: 12 (ref 5–15)
BUN: 23 mg/dL (ref 8–23)
CO2: 20 mmol/L — ABNORMAL LOW (ref 22–32)
Calcium: 8.7 mg/dL — ABNORMAL LOW (ref 8.9–10.3)
Chloride: 106 mmol/L (ref 98–111)
Creatinine, Ser: 1.18 mg/dL — ABNORMAL HIGH (ref 0.44–1.00)
GFR calc Af Amer: 56 mL/min — ABNORMAL LOW (ref >60)
GFR calc non Af Amer: 48 mL/min — ABNORMAL LOW (ref >60)
Glucose, Bld: 364 mg/dL — ABNORMAL HIGH (ref 70–99)
Potassium: 4.6 mmol/L (ref 3.5–5.1)
Sodium: 138 mmol/L (ref 135–145)
Total Bilirubin: 0.3 mg/dL (ref 0.3–1.2)
Total Protein: 6.2 g/dL — ABNORMAL LOW (ref 6.5–8.1)

## 2020-03-01 LAB — GLUCOSE, CAPILLARY
Glucose-Capillary: 361 mg/dL — ABNORMAL HIGH (ref 70–99)
Glucose-Capillary: 410 mg/dL — ABNORMAL HIGH (ref 70–99)
Glucose-Capillary: 239 mg/dL — ABNORMAL HIGH (ref 70–99)

## 2020-03-01 LAB — CBC
HCT: 36.1 % (ref 36.0–46.0)
Hemoglobin: 10.6 g/dL — ABNORMAL LOW (ref 12.0–15.0)
MCH: 21.8 pg — ABNORMAL LOW (ref 26.0–34.0)
MCHC: 29.4 g/dL — ABNORMAL LOW (ref 30.0–36.0)
MCV: 74.3 fL — ABNORMAL LOW (ref 80.0–100.0)
Platelets: 391 10*3/uL (ref 150–400)
RBC: 4.86 MIL/uL (ref 3.87–5.11)
RDW: 21.8 % — ABNORMAL HIGH (ref 11.5–15.5)
WBC: 11 10*3/uL — ABNORMAL HIGH (ref 4.0–10.5)
nRBC: 0 % (ref 0.0–0.2)

## 2020-03-01 LAB — LEGIONELLA PNEUMOPHILA SEROGP 1 UR AG: L. pneumophila Serogp 1 Ur Ag: NEGATIVE

## 2020-03-01 LAB — URINE CULTURE: Culture: NO GROWTH

## 2020-03-01 MED ORDER — METHYLPREDNISOLONE SODIUM SUCC 40 MG IJ SOLR
40.0000 mg | Freq: Two times a day (BID) | INTRAMUSCULAR | Status: DC
  Administered 2020-03-01: 40 mg via INTRAVENOUS
  Filled 2020-03-01 (×2): qty 1

## 2020-03-01 MED ORDER — IPRATROPIUM-ALBUTEROL 0.5-2.5 (3) MG/3ML IN SOLN
3.0000 mL | Freq: Three times a day (TID) | RESPIRATORY_TRACT | Status: DC
  Administered 2020-03-01 – 2020-03-03 (×5): 3 mL via RESPIRATORY_TRACT
  Filled 2020-03-01 (×6): qty 3

## 2020-03-01 MED ORDER — PREGABALIN 100 MG PO CAPS
100.0000 mg | ORAL_CAPSULE | Freq: Two times a day (BID) | ORAL | Status: DC
  Administered 2020-03-01 – 2020-03-03 (×4): 100 mg via ORAL
  Filled 2020-03-01 (×4): qty 1

## 2020-03-01 MED ORDER — INSULIN ASPART 100 UNIT/ML ~~LOC~~ SOLN
3.0000 [IU] | Freq: Three times a day (TID) | SUBCUTANEOUS | Status: DC
  Administered 2020-03-01 (×2): 3 [IU] via SUBCUTANEOUS

## 2020-03-01 MED ORDER — FUROSEMIDE 40 MG PO TABS
40.0000 mg | ORAL_TABLET | Freq: Every day | ORAL | Status: DC
  Administered 2020-03-01 – 2020-03-03 (×3): 40 mg via ORAL
  Filled 2020-03-01 (×3): qty 1

## 2020-03-01 MED ORDER — SODIUM CHLORIDE 0.9 % IV SOLN
INTRAVENOUS | Status: DC | PRN
  Administered 2020-03-01: 250 mL via INTRAVENOUS

## 2020-03-01 MED ORDER — INSULIN ASPART 100 UNIT/ML ~~LOC~~ SOLN
6.0000 [IU] | Freq: Three times a day (TID) | SUBCUTANEOUS | Status: DC
  Administered 2020-03-01 – 2020-03-03 (×5): 6 [IU] via SUBCUTANEOUS

## 2020-03-01 MED ORDER — INSULIN GLARGINE 100 UNIT/ML ~~LOC~~ SOLN
25.0000 [IU] | Freq: Every day | SUBCUTANEOUS | Status: DC
  Administered 2020-03-01 – 2020-03-03 (×3): 25 [IU] via SUBCUTANEOUS
  Filled 2020-03-01 (×3): qty 0.25

## 2020-03-01 MED ORDER — IPRATROPIUM-ALBUTEROL 0.5-2.5 (3) MG/3ML IN SOLN
3.0000 mL | Freq: Four times a day (QID) | RESPIRATORY_TRACT | Status: DC
  Administered 2020-03-01 (×2): 3 mL via RESPIRATORY_TRACT
  Filled 2020-03-01 (×2): qty 3

## 2020-03-01 NOTE — Evaluation (Signed)
Clinical/Bedside Swallow Evaluation Patient Details  Name: Kristen Dennis MRN: [id number] Date of Birth: 12-31-1955  Today's Date: 03/01/2020 Time: SLP Start Time (ACUTE ONLY): 0915 SLP Stop Time (ACUTE ONLY): 0929 SLP Time Calculation (min) (ACUTE ONLY): 14 min  Past Medical History:  Past Medical History:  Diagnosis Date  . Asthma   . Diabetes mellitus without complication (HCC)   . Encephalitis   . Hypertension   . Legally blind   . Renal disease   . Stroke [facility])    2011   Past Surgical History:  Past Surgical History:  Procedure Laterality Date  . CHOLECYSTECTOMY     HPI:  Pt is a 64 year old chronically ill female with history of COPD/chronic resp failure on O2-3L, chronic diastolic CHF RV failure, dementia, blindness who was brought to the ED with progressive shortness of breath for 2 weeks. CXR 8/20: Mild diffusely increased interstitial lung markings with mild bibasilar atelectasis and/or infiltrate.   Assessment / Plan / Recommendation Clinical Impression  Pt was seen for bedside swallow evaluation and she denied a history of dysphagia. Oral mechanism exam was [facility]; she presented with reduced mandibular teeth and stated that she wears maxillary dentures which are not at the hospital. She tolerated solids without overt s/sx of aspiration and mastication was functional despite reduced dentition. She exhibited throat clearing and a delayed cough when challenged with consecutive swallows of over 3oz of thin liquids and mild dyspnea was noted, suggesting possible aspiration. However, coughing and throat clearing were also noted at baseline. It is recommended that a regular texture diet with thin liquids be continued. SLP will follow and conduct a modified barium swallow study if these symptoms persist.  SLP Visit Diagnosis: Dysphagia, unspecified (R13.10)    Aspiration Risk  No limitations    Diet Recommendation Regular;Thin liquid   Liquid Administration via:  Cup;Straw Medication Administration: Whole meds with liquid Supervision: Patient able to self feed Postural Changes: Seated upright at 90 degrees    Other  Recommendations Oral Care Recommendations: Oral care BID   Follow up Recommendations Other (comment) (TBD)      Frequency and Duration min 2x/week  1 week       Prognosis Prognosis for Safe Diet Advancement: Good      Swallow Study   General Date of Onset: 02/29/20 HPI: Pt is a 64 year old chronically ill female with history of COPD/chronic resp failure on O2-3L, chronic diastolic CHF RV failure, dementia, blindness who was brought to the ED with progressive shortness of breath for 2 weeks. CXR 8/20: Mild diffusely increased interstitial lung markings with mild bibasilar atelectasis and/or infiltrate. Type of Study: Bedside Swallow Evaluation Previous Swallow Assessment: None Diet Prior to this Study: Regular;Thin liquids Temperature Spikes Noted: No Respiratory Status: Nasal cannula History of Recent Intubation: No Behavior/Cognition: Alert;Cooperative;Pleasant mood Oral Cavity Assessment: Within Functional Limits Oral Care Completed by SLP: No Oral Cavity - Dentition: Missing dentition (pt wears upper dentures which are not at hospital) Vision: Functional for self-feeding Self-Feeding Abilities: Able to feed self Patient Positioning: Upright in bed Baseline Vocal Quality: Normal Volitional Cough: Strong Volitional Swallow: Able to elicit    Oral/Motor/Sensory Function Overall Oral Motor/Sensory Function: Within functional limits   Ice Chips Ice chips: Within functional limits Presentation: Spoon   Thin Liquid Thin Liquid: Impaired Presentation: Straw Pharyngeal  Phase Impairments: Change in Vital Signs;Cough - Delayed;Throat Clearing - Immediate    Nectar Thick Nectar Thick Liquid: Not tested   Honey Thick Honey Thick Liquid: Not tested  Puree Puree: Within functional limits Presentation: Spoon   Solid      Solid: Within functional limits Presentation: Kristen Jacobsen I. Vear Clock, MS, CCC-SLP Acute Rehabilitation Services Office number [phone] Pager [phone]  Kristen Dennis 03/01/2020,9:37 AM

## 2020-03-01 NOTE — Progress Notes (Addendum)
PROGRESS NOTE    Kristen Dennis  ZOX:[id number] DOB: 05-06-1956 DOA: 02/28/2020 PCP: Claudean Severance, MD  Brief Narrative: 64 year old chronically ill female with history of COPD/chronic resp fai;ure onO2-3L,  Chronic diastolic CHF RV failure, dementia, blindness, lives at home with her sister was brought to the ED with progressive shortness of breath for 2 weeks. -In the ED she was initially hypotensive, resolved with gentle IV fluids,, chest x-ray noted interstitial findings concerning for possible pneumonia, temp was 101, lactic acid was 5.7.   Assessment & Plan:   Sepsis Acute on chronic hypoxic respiratory failure Community-acquired versus aspiration pneumonia COPD exacerbation -Clinically improving, sepsis was present on admission -Continue ceftriaxone and azithromycin today, transition to oral antibiotics in 1 to 2 days, cut down IV steroids and transition to oral prednisone soon -Blood cultures negative thus far -SLP evaluation completed, no overt aspiration noted -Also resume oral Lasix today, stop IV fluids  Acute on chronic diastolic CHF Moderate pulmonary hypertension RV failure/cor pulmonale -Clinically appears somewhat euvolemic -Resume oral Lasix today  Acute renal failure -In the setting of sepsis and ACE inhibitor -Creatinine 2.0 on admission, baseline is 1.2 -Improving, hold lisinopril  Uncontrolled type 2 diabetes mellitus with hyperglycemia -Increase a.m. Lantus, add meal coverage insulin -Decrease dose as steroids are tapered off  Dementia Blindness -Continue Aricept  DVT prophylaxis: Lovenox Code Status: Full code Family Communication: No family at bedside, left 2 messages for patient's sister yesterday Disposition Plan:  Status is: Inpatient  Remains inpatient appropriate because:Inpatient level of care appropriate due to severity of illness   Dispo: The patient is from: Home              Anticipated d/c is to: Home              Anticipated  d/c date is: 2 days              Patient currently is not medically stable to d/c.  Consultants:      Procedures:   Antimicrobials:    Subjective: -Feels better, breathing is improving, still has some cough Objective: Vitals:   03/01/20 0500 03/01/20 0533 03/01/20 0740 03/01/20 0815  BP:  127/74  (!) 157/74  Pulse:  69  79  Resp:  18  16  Temp:    98.2 F (36.8 C)  TempSrc:    Oral  SpO2:  98% 98% 94%  Weight: 90.3 kg     Height:        Intake/Output Summary (Last 24 hours) at 03/01/2020 1101 Last data filed at 03/01/2020 1036 Gross per 24 hour  Intake 1318.09 ml  Output 1025 ml  Net 293.09 ml   Filed Weights   02/28/20 2145 03/01/20 0500  Weight: 81.6 kg 90.3 kg    Examination:  General exam: Obese pleasant female sitting up in bed, awake alert, oriented to self and only partly to place, cognitive deficits CVS: S1-S2, regular rate rhythm Lungs: Poor air movement bilaterally, no wheezes today, few basilar rales  Abdomen: Soft, nontender, bowel sounds present  Extremities: No edema  Skin: No rashes on exposed skin Psychiatry: Poor insight and judgment    Data Reviewed:   CBC: Recent Labs  Lab 02/28/20 2200 02/29/20 0211 02/29/20 0601 03/01/20 0619  WBC 11.5*  --  8.8 11.0*  NEUTROABS 9.4*  --  7.9*  --   HGB 11.2* 11.9* 11.1* 10.6*  HCT 39.1 35.0* 40.3 36.1  MCV 78.0*  --  80.9 74.3*  PLT 437*  --  320 391   Basic Metabolic Panel: Recent Labs  Lab 02/28/20 2200 02/28/20 2316 02/29/20 0158 02/29/20 0211 02/29/20 0601 03/01/20 0619  NA 136  --  138 138 136 138  K 5.8*  --  4.8 4.5 4.6 4.6  CL 109  --  107  --  108 106  CO2 11*  --  16*  --  12* 20*  GLUCOSE 183*  --  201*  --  223* 364*  BUN 21  --  23  --  26* 23  CREATININE 1.82* 2.00* 1.77*  --  1.50* 1.18*  CALCIUM 8.1*  --  8.4*  --  8.1* 8.7*   GFR: Estimated Creatinine Clearance: 47.6 mL/min (A) (by C-G formula based on SCr of 1.18 mg/dL (H)). Liver Function Tests: Recent  Labs  Lab 02/29/20 0601 02/29/20 0905 03/01/20 0619  AST QUANTITY NOT SUFFICIENT, UNABLE TO PERFORM TEST 27 21  ALT QUANTITY NOT SUFFICIENT, UNABLE TO PERFORM TEST 40 33  ALKPHOS QUANTITY NOT SUFFICIENT, UNABLE TO PERFORM TEST 124 114  BILITOT QUANTITY NOT SUFFICIENT, UNABLE TO PERFORM TEST 0.5 0.3  PROT 5.9*  --  6.2*  ALBUMIN 2.3*  --  2.3*   No results for input(s): LIPASE, AMYLASE in the last 168 hours. No results for input(s): AMMONIA in the last 168 hours. Coagulation Profile: Recent Labs  Lab 02/29/20 0601  INR 1.2   Cardiac Enzymes: No results for input(s): CKTOTAL, CKMB, CKMBINDEX, TROPONINI in the last 168 hours. BNP (last 3 results) No results for input(s): PROBNP in the last 8760 hours. HbA1C: No results for input(s): HGBA1C in the last 72 hours. CBG: Recent Labs  Lab 02/29/20 1316 02/29/20 1740 02/29/20 2042  GLUCAP 263* 398* 449*   Lipid Profile: No results for input(s): CHOL, HDL, LDLCALC, TRIG, CHOLHDL, LDLDIRECT in the last 72 hours. Thyroid Function Tests: No results for input(s): TSH, T4TOTAL, FREET4, T3FREE, THYROIDAB in the last 72 hours. Anemia Panel: No results for input(s): VITAMINB12, FOLATE, FERRITIN, TIBC, IRON, RETICCTPCT in the last 72 hours. Urine analysis:    Component Value Date/Time   COLORURINE AMBER (A) 02/29/2020 0842   APPEARANCEUR HAZY (A) 02/29/2020 0842   LABSPEC 1.023 02/29/2020 0842   PHURINE 5.0 02/29/2020 0842   GLUCOSEU NEGATIVE 02/29/2020 0842   HGBUR NEGATIVE 02/29/2020 0842   BILIRUBINUR NEGATIVE 02/29/2020 0842   KETONESUR 5 (A) 02/29/2020 0842   PROTEINUR 30 (A) 02/29/2020 0842   NITRITE NEGATIVE 02/29/2020 0842   LEUKOCYTESUR NEGATIVE 02/29/2020 0842   Sepsis Labs: @LABRCNTIP (procalcitonin:4,lacticidven:4)  ) Recent Results (from the past 240 hour(s))  SARS Coronavirus 2 by RT PCR (hospital order, [facility]) Nasopharyngeal Nasopharyngeal Swab     Status: None   Collection Time:  02/28/20  9:53 PM   Specimen: Nasopharyngeal Swab  Result Value Ref Range Status   SARS Coronavirus 2 NEGATIVE NEGATIVE Final    Comment: (NOTE) SARS-CoV-2 target nucleic acids are NOT DETECTED.  The SARS-CoV-2 RNA is generally detectable in upper and lower respiratory specimens during the acute phase of infection. The lowest concentration of SARS-CoV-2 viral copies this assay can detect is 250 copies / mL. A negative result does not preclude SARS-CoV-2 infection and should not be used as the sole basis for treatment or other patient management decisions.  A negative result may occur with improper specimen collection / handling, submission of specimen other than nasopharyngeal swab, presence of viral mutation(s) within the areas targeted by this assay, and inadequate number of viral copies (<  250 copies / mL). A negative result must be combined with clinical observations, patient history, and epidemiological information.  Fact Sheet for Patients:   BoilerBrush.com.cy  Fact Sheet for Healthcare Providers: https://pope.com/  This test is not yet approved or  cleared by the [place] FDA and has been authorized for detection and/or diagnosis of SARS-CoV-2 by FDA under an Emergency Use Authorization (EUA).  This EUA will remain in effect (meaning this test can be used) for the duration of the COVID-19 declaration under Section 564(b)(1) of the Act, 21 U.S.C. section 360bbb-3(b)(1), unless the authorization is terminated or revoked sooner.  Performed at [facility], [address], [place], [place] [address]   Blood Culture (routine x 2)     Status: None (Preliminary result)   Collection Time: 02/28/20 10:55 PM   Specimen: BLOOD  Result Value Ref Range Status   Specimen Description BLOOD LEFT ANTECUBITAL  Final   Special Requests   Final    BOTTLES DRAWN AEROBIC AND ANAEROBIC Blood Culture results may not be optimal due to  an inadequate volume of blood received in culture bottles   Culture   Final    NO GROWTH 2 DAYS Performed at [facility], [address], [place], [place] [address]    Report Status PENDING  Incomplete  Blood Culture (routine x 2)     Status: None (Preliminary result)   Collection Time: 02/29/20  2:23 AM   Specimen: BLOOD LEFT HAND  Result Value Ref Range Status   Specimen Description BLOOD LEFT HAND  Final   Special Requests   Final    BOTTLES DRAWN AEROBIC AND ANAEROBIC Blood Culture adequate volume   Culture   Final    NO GROWTH 1 DAY Performed at [facility], [address] [place], [place] [address]    Report Status PENDING  Incomplete  Urine culture     Status: None   Collection Time: 02/29/20  8:36 AM   Specimen: In/Out Cath Urine  Result Value Ref Range Status   Specimen Description IN/OUT CATH URINE  Final   Special Requests NONE  Final   Culture   Final    NO GROWTH Performed at [facility], [address], [place], [place] [address]    Report Status 03/01/2020 FINAL  Final         Radiology Studies: DG Chest Portable 1 View  Result Date: 02/28/2020 CLINICAL DATA:  Shortness of breath. EXAM: PORTABLE CHEST 1 VIEW COMPARISON:  February 20, 2020 FINDINGS: Mild diffusely increased interstitial lung markings are seen. Mild areas of atelectasis and/or infiltrate are noted within the bilateral lung bases. There is no evidence of a pleural effusion or pneumothorax. The heart size and mediastinal contours are within normal limits. There is mild calcification of the aortic arch. The visualized skeletal structures are unremarkable. IMPRESSION: Mild diffusely increased interstitial lung markings with mild bibasilar atelectasis and/or infiltrate. Electronically Signed   By: Aram Candela M.D.   On: 02/28/2020 22:11        Scheduled Meds: . budesonide (PULMICORT) nebulizer solution  0.25 mg Nebulization BID  . donepezil  5 mg Oral QHS  . DULoxetine   60 mg Oral QHS  . enoxaparin (LOVENOX) injection  40 mg Subcutaneous Q24H  . insulin aspart  3 Units Subcutaneous TID WC  . insulin glargine  25 Units Subcutaneous Daily  . insulin glargine  40 Units Subcutaneous QHS  . ipratropium-albuterol  3 mL Nebulization  QID  . methylPREDNISolone (SOLU-MEDROL) injection  60 mg Intravenous Q12H  . pregabalin  100 mg Oral TID   Continuous Infusions: . azithromycin Stopped (03/01/20 0841)  . cefTRIAXone (ROCEPHIN)  IV Stopped (03/01/20 0841)     LOS: 1 day    Time spent:23min  Zannie Cove, MD [facility]  03/01/2020, 11:01 AM

## 2020-03-01 NOTE — Progress Notes (Signed)
Kristen Dennis, MRN:  [id number], DOB:  Jun 01, 1956, LOS: 1 ADMISSION DATE:  02/28/2020, CONSULTATION DATE:  02/29/20 REFERRING MD:  ED, CHIEF COMPLAINT:  Dyspnea  Brief History   Ms Kristen Dennis is a 64 year old woman with a history of COPD on 3L o2 at home, evidence of Pulmonary HTN and RV failure on prior echo, dementia, blindness, presented with 2 weeks SOB.  Cough.   History of present illness   Shortness of breath and cough x several weeks.  Hx from chart, patient not providing much hx to me, awake and alert but only giving short answers.   Was prescribed antibiotics and cough syrup last week.   Was hypotensive in ED, resolved with gentle fluid.    Now BP nl range, sat 92% on 6l  nasal cannula.   Past Medical History    Asthma   . Diabetes mellitus without complication (HCC)   . Encephalitis   . Hypertension   . Legally blind   . Renal disease   . Stroke [facility])    2011     Significant Hospital Events     Consults:    Procedures:    Significant Diagnostic Tests:  cxr IMPRESSION: Mild diffusely increased interstitial lung markings with mild bibasilar atelectasis and/or infiltrate. Micro Data:    Antimicrobials:  Ceftriaxone, azithro  Interim history/subjective:   Patient states that she is feeling better.   Objective   Blood pressure (!) 126/91, pulse 85, temperature 97.8 F (36.6 C), temperature source Oral, resp. rate 18, height 5' (1.524 m), weight 90.3 kg, last menstrual period 05/03/2012, SpO2 90 %.    FiO2 (%):  [3 %] 3 %   Intake/Output Summary (Last 24 hours) at 03/01/2020 1706 Last data filed at 03/01/2020 1309 Gross per 24 hour  Intake 1538.09 ml  Output 1025 ml  Net 513.09 ml   Filed Weights   02/28/20 2145 03/01/20 0500  Weight: 81.6 kg 90.3 kg    Examination: General: eldlery fm, resting in bed, no distress  HENT: ncat, severe visual impairment  Lungs: clear bl, no wheeze  Cardiovascular: RRR, s1 s2  Abdomen: soft, nt nd   Extremities: no edema  Neuro: alert, watching tv   No flowsheet data found.   Resolved Hospital Problem list     Assessment & Plan:    CAP with Sepsis, strep pnneumo ag positive  AECOPD, no PFTs on file  Pulmonary hypertension, related to diastolic heart failure and possible underlying copd  ARF, related to above  Hyperglycemia  Anemia, low MCV  Plans:  Continue abx, can go to oral prednisone and taper  Diuresis to maintain euvolemia  Titrate fio2 to maintain sats >90%  Watch UOP Glycemic management per primary   Pulmonary will follow.   Labs   CBC: Recent Labs  Lab 02/28/20 2200 02/29/20 0211 02/29/20 0601 03/01/20 0619  WBC 11.5*  --  8.8 11.0*  NEUTROABS 9.4*  --  7.9*  --   HGB 11.2* 11.9* 11.1* 10.6*  HCT 39.1 35.0* 40.3 36.1  MCV 78.0*  --  80.9 74.3*  PLT 437*  --  320 391    Basic Metabolic Panel: Recent Labs  Lab 02/28/20 2200 02/28/20 2316 02/29/20 0158 02/29/20 0211 02/29/20 0601 03/01/20 0619  NA 136  --  138 138 136 138  K 5.8*  --  4.8 4.5 4.6 4.6  CL 109  --  107  --  108 106  CO2 11*  --  16*  --  12* 20*  GLUCOSE 183*  --  201*  --  223* 364*  BUN 21  --  23  --  26* 23  CREATININE 1.82* 2.00* 1.77*  --  1.50* 1.18*  CALCIUM 8.1*  --  8.4*  --  8.1* 8.7*   GFR: Estimated Creatinine Clearance: 47.6 mL/min (A) (by C-G formula based on SCr of 1.18 mg/dL (H)). Recent Labs  Lab 02/28/20 2200 02/29/20 0037 02/29/20 0601 02/29/20 1326 03/01/20 0619  PROCALCITON  --   --   --  0.18  --   WBC 11.5*  --  8.8  --  11.0*  LATICACIDVEN 5.7* 5.7* 2.0*  --   --     Liver Function Tests: Recent Labs  Lab 02/29/20 0601 02/29/20 0905 03/01/20 0619  AST QUANTITY NOT SUFFICIENT, UNABLE TO PERFORM TEST 27 21  ALT QUANTITY NOT SUFFICIENT, UNABLE TO PERFORM TEST 40 33  ALKPHOS QUANTITY NOT SUFFICIENT, UNABLE TO PERFORM TEST 124 114  BILITOT QUANTITY NOT SUFFICIENT, UNABLE TO PERFORM TEST 0.5 0.3  PROT 5.9*  --  6.2*  ALBUMIN 2.3*  --   2.3*   No results for input(s): LIPASE, AMYLASE in the last 168 hours. No results for input(s): AMMONIA in the last 168 hours.  ABG    Component Value Date/Time   PHART 7.330 (L) 02/29/2020 0211   PCO2ART 29.8 (L) 02/29/2020 0211   PO2ART 60 (L) 02/29/2020 0211   HCO3 15.7 (L) 02/29/2020 0211   TCO2 17 (L) 02/29/2020 0211   ACIDBASEDEF 9.0 (H) 02/29/2020 0211   O2SAT 89.0 02/29/2020 0211     Coagulation Profile: Recent Labs  Lab 02/29/20 0601  INR 1.2    Cardiac Enzymes: No results for input(s): CKTOTAL, CKMB, CKMBINDEX, TROPONINI in the last 168 hours.  HbA1C: Hgb A1c MFr Bld  Date/Time Value Ref Range Status  01/19/2020 02:25 AM 5.7 (H) 4.8 - 5.6 % Final    Comment:    (NOTE) Pre diabetes:          5.7%-6.4%  Diabetes:              >6.4%  Glycemic control for   <7.0% adults with diabetes     CBG: Recent Labs  Lab 02/29/20 1316 02/29/20 1740 02/29/20 2042 03/01/20 1300 03/01/20 1646  GLUCAP 263* 398* 449* 361* 410*    Review of Systems:   Unable to assess due to patient cooperation  Past Medical History  She,  has a past medical history of Asthma, Diabetes mellitus without complication (HCC), Encephalitis, Hypertension, Legally blind, Renal disease, and Stroke (HCC).   Surgical History    Past Surgical History:  Procedure Laterality Date  . CHOLECYSTECTOMY       Social History   reports that she has quit smoking. She has never used smokeless tobacco. She reports that she does not drink alcohol and does not use drugs.   Family History   Her family history includes Cancer in her mother; Heart failure in her father.   Allergies No Known Allergies   Home Medications  Prior to Admission medications   Medication Sig Start Date End Date Taking? Authorizing Provider  albuterol (PROVENTIL HFA;VENTOLIN HFA) 108 (90 Base) MCG/ACT inhaler Inhale 2 puffs into the lungs every 6 (six) hours as needed for wheezing or shortness of breath. 07/18/17    Maxie Barb, MD  amLODipine (NORVASC) 5 MG tablet Take 5 mg by mouth daily. 05/16/17   [provider]  donepezil (ARICEPT) 5 MG tablet  Take 5 mg by mouth at bedtime. 05/16/17   [provider]  DULoxetine (CYMBALTA) 60 MG capsule Take 60 mg by mouth at bedtime. 03/14/17   [provider]  ergocalciferol (VITAMIN D2) 1.25 MG (50000 UT) capsule Take 1 capsule by mouth once a week. 01/09/20   [provider]  fluticasone furoate-vilanterol (BREO ELLIPTA) 100-25 MCG/INH AEPB Inhale 1 puff into the lungs daily. 12/29/19   [provider]  furosemide (LASIX) 40 MG tablet Take 40 mg by mouth daily.    [provider]  hydrALAZINE (APRESOLINE) 50 MG tablet Take 50 mg by mouth 3 (three) times daily. 07/10/17   [provider]  insulin glargine (LANTUS SOLOSTAR) 100 UNIT/ML Solostar Pen Inject 50 Units into the skin at bedtime.  06/03/19   [provider]  lisinopril (PRINIVIL,ZESTRIL) 10 MG tablet Take 10 mg by mouth every evening.  07/10/17   [provider]  metFORMIN (GLUCOPHAGE) 500 MG tablet Take 1,000 mg by mouth 2 (two) times daily. 05/18/17   [provider]  metoprolol tartrate (LOPRESSOR) 50 MG tablet Take 50 mg by mouth 2 (two) times daily. 07/10/17   [provider]  montelukast (SINGULAIR) 10 MG tablet Take 10 mg by mouth at bedtime. 07/10/17   [provider]  omeprazole (PRILOSEC) 20 MG capsule Take 20 mg by mouth every evening.  04/14/17   [provider]  potassium chloride SA (K-DUR,KLOR-CON) 20 MEQ tablet Take 20 mEq by mouth daily.    [provider]  predniSONE (DELTASONE) 10 MG tablet 4 tablet daily  for 3 days 3 tablet daily for 3 days 2 tablet daily for 3 days 1 tablet daily for 3 days 01/22/20   Dorcas Carrow, MD  pregabalin (LYRICA) 100 MG capsule Take 1 capsule (100 mg total) by mouth 3 (three) times daily. 09/22/17   Fayrene Helper, PA-C  sitaGLIPtin  (JANUVIA) 25 MG tablet Take 1 tablet by mouth daily. 12/24/18   [provider]      Josephine Igo, DO [facility] 03/01/2020 5:06 PM

## 2020-03-02 LAB — CBC
HCT: 35.7 % — ABNORMAL LOW (ref 36.0–46.0)
Hemoglobin: 10.7 g/dL — ABNORMAL LOW (ref 12.0–15.0)
MCH: 22.3 pg — ABNORMAL LOW (ref 26.0–34.0)
MCHC: 30 g/dL (ref 30.0–36.0)
MCV: 74.5 fL — ABNORMAL LOW (ref 80.0–100.0)
Platelets: 417 10*3/uL — ABNORMAL HIGH (ref 150–400)
RBC: 4.79 MIL/uL (ref 3.87–5.11)
RDW: 21.7 % — ABNORMAL HIGH (ref 11.5–15.5)
WBC: 14 10*3/uL — ABNORMAL HIGH (ref 4.0–10.5)
nRBC: 0.2 % (ref 0.0–0.2)

## 2020-03-02 LAB — BASIC METABOLIC PANEL
Anion gap: 10 (ref 5–15)
BUN: 22 mg/dL (ref 8–23)
CO2: 22 mmol/L (ref 22–32)
Calcium: 8.9 mg/dL (ref 8.9–10.3)
Chloride: 107 mmol/L (ref 98–111)
Creatinine, Ser: 1.01 mg/dL — ABNORMAL HIGH (ref 0.44–1.00)
GFR calc Af Amer: >60 mL/min (ref >60)
GFR calc non Af Amer: 58 mL/min — ABNORMAL LOW (ref >60)
Glucose, Bld: 140 mg/dL — ABNORMAL HIGH (ref 70–99)
Potassium: 4.2 mmol/L (ref 3.5–5.1)
Sodium: 139 mmol/L (ref 135–145)

## 2020-03-02 LAB — GLUCOSE, CAPILLARY
Glucose-Capillary: 148 mg/dL — ABNORMAL HIGH (ref 70–99)
Glucose-Capillary: 203 mg/dL — ABNORMAL HIGH (ref 70–99)
Glucose-Capillary: 183 mg/dL — ABNORMAL HIGH (ref 70–99)
Glucose-Capillary: 248 mg/dL — ABNORMAL HIGH (ref 70–99)

## 2020-03-02 MED ORDER — PREDNISONE 50 MG PO TABS
50.0000 mg | ORAL_TABLET | Freq: Every day | ORAL | Status: DC
  Administered 2020-03-02: 50 mg via ORAL
  Filled 2020-03-02: qty 1

## 2020-03-02 MED ORDER — AZITHROMYCIN 500 MG PO TABS: 500.0000 mg | ORAL_TABLET | Freq: Every day | ORAL | Status: DC

## 2020-03-02 MED ORDER — CEFDINIR 300 MG PO CAPS
300.0000 mg | ORAL_CAPSULE | Freq: Two times a day (BID) | ORAL | Status: DC
  Administered 2020-03-02 – 2020-03-03 (×3): 300 mg via ORAL
  Filled 2020-03-02 (×3): qty 1

## 2020-03-02 NOTE — Plan of Care (Signed)
  Problem: Health Behavior/Discharge Planning: Goal: Ability to manage health-related needs will improve 03/02/2020 0549 by Smith-Fischer, Lawernce Pitts, RN Outcome: Progressing 03/02/2020 0542 by Smith-Fischer, Lawernce Pitts, RN Outcome: Progressing   Problem: Nutrition: Goal: Adequate nutrition will be maintained 03/02/2020 0549 by Smith-Fischer, Lawernce Pitts, RN Outcome: Progressing 03/02/2020 0542 by Smith-Fischer, Lawernce Pitts, RN Outcome: Progressing   Problem: Coping: Goal: Level of anxiety will decrease 03/02/2020 0549 by Smith-Fischer, Lawernce Pitts, RN Outcome: Progressing 03/02/2020 0542 by Smith-Fischer, Lawernce Pitts, RN Outcome: Progressing   Problem: Elimination: Goal: Will not experience complications related to urinary retention 03/02/2020 0549 by Smith-Fischer, Lawernce Pitts, RN Outcome: Progressing 03/02/2020 0542 by Smith-Fischer, Lawernce Pitts, RN Outcome: Progressing   Problem: Skin Integrity: Goal: Risk for impaired skin integrity will decrease 03/02/2020 0549 by Smith-Fischer, Lawernce Pitts, RN Outcome: Progressing 03/02/2020 0542 by Smith-Fischer, Lawernce Pitts, RN Outcome: Progressing

## 2020-03-02 NOTE — Progress Notes (Signed)
  Speech Language Pathology Treatment: Dysphagia  Patient Details Name: Kristen Dennis MRN: [id number] DOB: 1955/08/26 Today's Date: 03/02/2020 Time: 5973-3125 SLP Time Calculation (min) (ACUTE ONLY): 20 min  Assessment / Plan / Recommendation Clinical Impression   Pt seen at bedside for assessment of diet tolerance following BSE completed yesterday. Pt reports no overt s/s aspiration with meals. She does mention intermittent difficulty with large pills getting "hung up". She was encouraged to cut/crush large pills, or follow pills with a bolus of puree (applesauce, pudding, etc) to facilitate clearing.  Safe swallow precautions posted at [facility] and RN informed. Diet changed slightly to request solids to be cut into bite size pieces.  No further ST intervention recommended at this time. Please reconsult if needs arise.   HPI HPI: Pt is a 64 year old chronically ill female with history of COPD/chronic resp failure on G8-7V, chronic diastolic CHF RV failure, dementia, blindness who was brought to the ED with progressive shortness of breath for 2 weeks. CXR 8/20: Mild diffusely increased interstitial lung markings with mild bibasilar atelectasis and/or infiltrate.      SLP Plan  All goals met       Recommendations  Diet recommendations: Regular;Thin liquid Liquids provided via: Straw;Cup Medication Administration: Whole meds with liquid Supervision: Patient able to self feed;Staff to assist with self feeding;Intermittent supervision to cue for compensatory strategies Compensations: Slow rate;Small sips/bites Postural Changes and/or Swallow Maneuvers: Seated upright 90 degrees;Upright 30-60 min after meal                Oral Care Recommendations: Oral care BID Follow up Recommendations: None SLP Visit Diagnosis: Dysphagia, unspecified (R13.10) Plan: All goals met       GO               Kristen Dennis B. Quentin Ore, [facility], [place] Speech Language Pathologist Office: [phone] Pager:  [phone]  Shonna Chock 03/02/2020, 10:46 AM

## 2020-03-02 NOTE — Plan of Care (Signed)
  Problem: Education: Goal: Knowledge of General Education information will improve Description Including pain rating scale, medication(s)/side effects and non-pharmacologic comfort measures Outcome: Progressing   

## 2020-03-02 NOTE — Progress Notes (Signed)
NAMECathi Dennis, MRN:  [id number], DOB:  1955/09/15, LOS: 2 ADMISSION DATE:  02/28/2020, CONSULTATION DATE:  02/29/20 REFERRING MD:  ED, CHIEF COMPLAINT:  Dyspnea  Brief History   Ms Winiarski is a 64 year old woman with a history of COPD on 3L o2 at home, evidence of Pulmonary HTN and RV failure on prior echo, dementia, blindness, presented with 2 weeks SOB.  Cough.   History of present illness   Shortness of breath and cough x several weeks.  Hx from chart, patient not providing much hx to me, awake and alert but only giving short answers.   Was prescribed antibiotics and cough syrup last week.   Was hypotensive in ED, resolved with gentle fluid.    Now BP nl range, sat 92% on 6l  nasal cannula.   Past Medical History    Asthma   . Diabetes mellitus without complication (HCC)   . Encephalitis   . Hypertension   . Legally blind   . Renal disease   . Stroke [facility])    2011     Significant Hospital Events     Consults:    Procedures:    Significant Diagnostic Tests:  cxr IMPRESSION: Mild diffusely increased interstitial lung markings with mild bibasilar atelectasis and/or infiltrate. Micro Data:    Antimicrobials:  Ceftriaxone, azithro  Interim history/subjective:  Oxygen weaned to 2-3L via [place]. She reports improved cough and less sputum production since admission.  Objective   Blood pressure 134/67, pulse 80, temperature (!) 97.5 F (36.4 C), temperature source Oral, resp. rate 19, height 5' (1.524 m), weight 92.4 kg, last menstrual period 05/03/2012, SpO2 96 %.        Intake/Output Summary (Last 24 hours) at 03/02/2020 1111 Last data filed at 03/02/2020 0900 Gross per 24 hour  Intake 1034.23 ml  Output 475 ml  Net 559.23 ml   Filed Weights   02/28/20 2145 03/01/20 0500 03/02/20 0336  Weight: 81.6 kg 90.3 kg 92.4 kg   Physical Exam: General: Elderly-appearing, no acute distress HENT: , AT, OP clear, MMM, severe visual impairment Eyes: EOMI,  no scleral icterus Respiratory: Diminished breath sounds however clear to auscultation bilaterally.  No crackles, wheezing or rales Cardiovascular: RRR, -M/R/G, no JVD Extremities:-Edema,-tenderness Neuro: AAO x3, CNII-XII grossly intact, occasionally perseverates in speech  Skin: Intact, no rashes or bruising Psych: Normal mood, normal affect  Resolved Hospital Problem list     Assessment & Plan:   Acute hypoxemic respiratory failure 2/2 pneumonia - improving Sepsis secondary to streptococcal pneumonia Acute COPD exacerbation - no PFTs on file  Pulmonary hypertension, related to diastolic heart failure and possible underlying copd  Acute renal failure - improving Hyperglycemia - stress induced. Improved. May worsen on steroids so will need monitoring  Plans:  Complete abx for streptococcal pneumonia Recommend prednisone taper 50mg  x 2d, 40mg  x2d, 30 mg x 2d, then STOP Diuresis to maintain euvolemia, monitor UOP/Cr  Titrate FIO2 to maintain sats >90%  Glycemic management per primary   Ok to see me for hospital follow-up when she is ready for discharge. Pulmonary follow-up info placed on AVS.   Pulmonary available as needed. Please call for questions or concerns.  , M.D. [facility] Pulmonary/Critical Care Medicine 03/02/2020 11:21 AM    Labs   CBC: Recent Labs  Lab 02/28/20 2200 02/29/20 0211 02/29/20 0601 03/01/20 0619 03/02/20 0657  WBC 11.5*  --  8.8 11.0* 14.0*  NEUTROABS 9.4*  --  7.9*  --   --  HGB 11.2* 11.9* 11.1* 10.6* 10.7*  HCT 39.1 35.0* 40.3 36.1 35.7*  MCV 78.0*  --  80.9 74.3* 74.5*  PLT 437*  --  320 391 417*    Basic Metabolic Panel: Recent Labs  Lab 02/28/20 2200 02/28/20 2200 02/28/20 2316 02/29/20 0158 02/29/20 0211 02/29/20 0601 03/01/20 0619 03/02/20 0657  NA 136   < >  --  138 138 136 138 139  K 5.8*   < >  --  4.8 4.5 4.6 4.6 4.2  CL 109  --   --  107  --  108 106 107  CO2 11*  --   --  16*  --  12* 20* 22  GLUCOSE  183*  --   --  201*  --  223* 364* 140*  BUN 21  --   --  23  --  26* 23 22  CREATININE 1.82*   < > 2.00* 1.77*  --  1.50* 1.18* 1.01*  CALCIUM 8.1*  --   --  8.4*  --  8.1* 8.7* 8.9   < > = values in this interval not displayed.   GFR: Estimated Creatinine Clearance: 56.4 mL/min (A) (by C-G formula based on SCr of 1.01 mg/dL (H)). Recent Labs  Lab 02/28/20 2200 02/29/20 0037 02/29/20 0601 02/29/20 1326 03/01/20 0619 03/02/20 0657  PROCALCITON  --   --   --  0.18  --   --   WBC 11.5*  --  8.8  --  11.0* 14.0*  LATICACIDVEN 5.7* 5.7* 2.0*  --   --   --     Liver Function Tests: Recent Labs  Lab 02/29/20 0601 02/29/20 0905 03/01/20 0619  AST QUANTITY NOT SUFFICIENT, UNABLE TO PERFORM TEST 27 21  ALT QUANTITY NOT SUFFICIENT, UNABLE TO PERFORM TEST 40 33  ALKPHOS QUANTITY NOT SUFFICIENT, UNABLE TO PERFORM TEST 124 114  BILITOT QUANTITY NOT SUFFICIENT, UNABLE TO PERFORM TEST 0.5 0.3  PROT 5.9*  --  6.2*  ALBUMIN 2.3*  --  2.3*   No results for input(s): LIPASE, AMYLASE in the last 168 hours. No results for input(s): AMMONIA in the last 168 hours.  ABG    Component Value Date/Time   PHART 7.330 (L) 02/29/2020 0211   PCO2ART 29.8 (L) 02/29/2020 0211   PO2ART 60 (L) 02/29/2020 0211   HCO3 15.7 (L) 02/29/2020 0211   TCO2 17 (L) 02/29/2020 0211   ACIDBASEDEF 9.0 (H) 02/29/2020 0211   O2SAT 89.0 02/29/2020 0211     Coagulation Profile: Recent Labs  Lab 02/29/20 0601  INR 1.2    Cardiac Enzymes: No results for input(s): CKTOTAL, CKMB, CKMBINDEX, TROPONINI in the last 168 hours.  HbA1C: Hgb A1c MFr Bld  Date/Time Value Ref Range Status  01/19/2020 02:25 AM 5.7 (H) 4.8 - 5.6 % Final    Comment:    (NOTE) Pre diabetes:          5.7%-6.4%  Diabetes:              >6.4%  Glycemic control for   <7.0% adults with diabetes     CBG: Recent Labs  Lab 02/29/20 2042 03/01/20 1300 03/01/20 1646 03/01/20 2129 03/02/20 0701  GLUCAP 449* 361* 410* 239* 148*

## 2020-03-02 NOTE — Evaluation (Signed)
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    Clinical Impression   This 64 y/o female presents with the above. PTA pt reports being mod independent with mobility using RW, receives assist from sister for ADL tasks (whom she lives with). Pt very pleasant and willing to participate in therapy session. She currently requires overall minA for functional transfers using RW, requiring up to maxA for LB ADL. Pt requiring multimodal cues during mobility tasks given baseline visual deficits. Pt on 3L O2 during session with SpO2 >92% throughout. She will benefit from continued acute OT services and currently recommend follow up [facility] services after discharge to progress pt's safety and independence with ADL and mobility.     Follow Up Recommendations  Home health OT;Supervision/Assistance - 24 hour    Equipment Recommendations  3 in 1 bedside commode (for use in shower)           Precautions / Restrictions Precautions Precautions: Fall Restrictions Weight Bearing Restrictions: No      Mobility Bed Mobility Overal bed mobility: Needs Assistance Bed Mobility: Supine to Sit     Supine to sit: Min assist     General bed mobility comments: for trunk elevation, increased time/effort to scoot hips towards EOB, HOB elevated   Transfers Overall transfer level: Needs assistance Equipment used: Rolling walker (2 wheeled) Transfers: Sit to/from [employer] Sit to Stand: Min assist Stand pivot transfers: Min assist       General transfer comment: steadying assist initially to rise from EOB, minguard from recliner; VCs for hand placement and for navigating to recliner given visual impairments      Balance Overall balance assessment: Needs assistance Sitting-balance support: No upper extremity supported;Feet supported Sitting balance-Leahy Scale: Fair     Standing balance support: Bilateral upper extremity supported;During functional activity Standing balance-Leahy Scale: Poor Standing balance comment: relies on UE support on RW                           ADL either performed or assessed with clinical judgement   ADL Overall ADL's : Needs assistance/impaired Eating/Feeding: Set up;Sitting   Grooming: Wash/dry face;Supervision/safety;Sitting   Upper Body Bathing: Minimal assistance;Sitting   Lower Body Bathing: Moderate assistance;Sitting/lateral leans;Sit to/from stand   Upper Body Dressing : Minimal assistance;Sitting   Lower Body Dressing: Maximal assistance;Sit to/from stand Lower Body Dressing Details (indicate cue type and reason): assist to don socks seated EOB, minA for standing balance  Toilet Transfer: Minimal assistance;Stand-pivot;BSC;RW Toilet Transfer Details (indicate cue type and reason): simulated via transfer to recliner  Toileting- Clothing Manipulation and Hygiene: Moderate assistance;Sitting/lateral lean;Sit to/from stand       Functional mobility during ADLs: Minimal assistance;Rolling walker       Vision Baseline Vision/History: Legally blind Patient Visual Report: Diplopia ("I see 3 or 4") Vision Assessment?: Vision impaired- to be further tested in functional context Additional Comments: pt with baseline visual deficits, reports able to see shapes but it is blurry, endorses intermittent double vision (or 3 or 4)     Perception     Praxis      Pertinent Vitals/Pain Pain Assessment: No/denies pain     Hand  Dominance     Extremity/Trunk Assessment Upper Extremity Assessment Upper Extremity Assessment: Generalized weakness   Lower Extremity Assessment Lower Extremity Assessment: Defer to PT evaluation   Cervical /  Trunk Assessment Cervical / Trunk Assessment: Normal   Communication Communication Communication: No difficulties   Cognition Arousal/Alertness: Awake/alert Behavior During Therapy: WFL for tasks assessed/performed Overall Cognitive Status: History of cognitive impairments - at baseline                                 General Comments: difficulty with processing certain instructions - when attempting to have pt scoot back in the recliner pt proceeding to stand and take steps and with difficulty understanding what therapist wanted her to do    General Comments      Exercises    Shoulder Instructions      Home Living Family/patient expects to be discharged to:: Private residence Living Arrangements: Other relatives Available Help at Discharge: Family (sister) Type of Home: House Home Access: Stairs to enter Secretary/administrator of Steps: 3         Bathroom Shower/Tub: Chief Strategy Officer: Standard     Home Equipment: Environmental consultant - standard (home 2LO2)          Prior Functioning/Environment Level of Independence: Needs assistance  Gait / Transfers Assistance Needed: amb in household with min asssist with RW ADL's / Homemaking Assistance Needed: reports sister assists some with ADL tasks             OT Problem List: Decreased strength;Decreased range of motion;Decreased activity tolerance;Impaired balance (sitting and/or standing);Decreased safety awareness;Decreased knowledge of use of DME or AE;Obesity;Cardiopulmonary status limiting activity      OT Treatment/Interventions: Self-care/ADL training;Therapeutic exercise;Energy conservation;DME and/or AE instruction;Therapeutic activities;Patient/family education;Balance training    OT Goals(Current goals can be found in the care plan section) Acute Rehab OT Goals Patient Stated Goal: to go home OT Goal Formulation: With patient Time For Goal Achievement: 03/16/20 Potential to Achieve  Goals: Good  OT Frequency: Min 2X/week   Barriers to D/C:            Co-evaluation              AM-PAC OT "6 Clicks" Daily Activity     Outcome Measure Help from another person eating meals?: A Little Help from another person taking care of personal grooming?: A Little Help from another person toileting, which includes using toliet, bedpan, or urinal?: A Lot Help from another person bathing (including washing, rinsing, drying)?: A Lot Help from another person to put on and taking off regular upper body clothing?: A Little Help from another person to put on and taking off regular lower body clothing?: A Lot 6 Click Score: 15   End of Session Equipment Utilized During Treatment: Gait belt;Rolling walker;Oxygen Nurse Communication: Mobility status  Activity Tolerance: Patient tolerated treatment well Patient left: in chair;with call bell/phone within reach;with chair alarm set  OT Visit Diagnosis: Muscle weakness (generalized) (M62.81);Unsteadiness on feet (R26.81)                Time: 1610-9604 OT Time Calculation (min): 31 min Charges:  OT General Charges $OT Visit: 1 Visit OT Evaluation $OT Eval Moderate Complexity: 1 Mod OT Treatments $Self Care/Home Management : 8-22 mins  Marcy Siren, OT Acute Rehabilitation Services Pager [phone] Office [phone]   Orlando Penner 03/02/2020, 1:42 PM

## 2020-03-02 NOTE — Evaluation (Signed)
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   Clinical Impression  Pt admitted with above diagnosis. Pt was able to ambulate with min guard assist with RW with limited distance due to pt fatigue.  Pt has assist at home and should progress well however would benefit from rollator to use at home.  Will continue to follow acutely.  Pt currently with functional limitations due to the deficits listed below (see PT Problem List). Pt will benefit from skilled PT to increase their independence and safety with mobility to allow discharge to the venue listed below.      Follow Up Recommendations Home health PT;Supervision/Assistance - 24 hour    Equipment Recommendations   (rollator)    Recommendations for Other Services       Precautions / Restrictions Precautions Precautions: Fall Restrictions Weight Bearing Restrictions: No      Mobility  Bed Mobility               General bed mobility comments: NT in chair  Transfers Overall transfer level: Needs assistance Equipment used: Rolling walker (2 wheeled) Transfers: Sit to/from Stand Sit to Stand: Min guard         General transfer comment: No assist needed but did need cues for hand placement  Ambulation/Gait Ambulation/Gait assistance: Min guard Gait Distance (Feet): 45 Feet Assistive device: Rolling walker (2 wheeled) Gait Pattern/deviations: Step-through pattern;Decreased stride length;Trunk flexed;Wide base of support   Gait velocity interpretation: <1.31 ft/sec, indicative of household ambulator General Gait Details: Pt ambulated to the door and back to chair. Was fatigued after short walk.  Pt needed the 3LO2 to keep sats >90%.   Stairs             Wheelchair Mobility    Modified Rankin (Stroke Patients Only)       Balance Overall balance assessment: Needs assistance Sitting-balance support: No upper extremity supported;Feet supported Sitting balance-Leahy Scale: Fair     Standing balance support: Bilateral upper extremity supported;During functional activity Standing balance-Leahy Scale: Poor Standing balance comment: relies on UE support on RW                             Pertinent Vitals/Pain Pain Assessment: No/denies pain    Home Living Family/patient expects to be discharged to:: Private residence Living Arrangements: Other relatives Available Help at Discharge: Family (sister) Type of Home: House Home Access: Stairs to enter   Secretary/administrator of Steps: 3   Home Equipment: Environmental consultant - standard (home 2LO2)      Prior Function Level of Independence: Needs assistance   Gait / Transfers Assistance Needed: amb in household with min asssist with RW  ADL's / Homemaking Assistance Needed: reports sister assists some with ADL tasks         Hand Dominance        Extremity/Trunk Assessment   Upper Extremity Assessment Upper Extremity Assessment: Defer to OT evaluation    Lower Extremity Assessment Lower Extremity Assessment: Generalized weakness    Cervical / Trunk Assessment Cervical / Trunk Assessment: Normal  Communication   Communication: No difficulties  Cognition Arousal/Alertness: Awake/alert Behavior During Therapy: WFL for tasks assessed/performed Overall  Cognitive Status: Within Functional Limits for tasks assessed                                        General Comments General comments (skin integrity, edema, etc.): 93% on 3LO2    Exercises General Exercises - Lower Extremity Ankle Circles/Pumps: AROM;Both;10 reps;Supine Long Arc Quad: AROM;Both;10 reps;Seated Hip Flexion/Marching: AROM;Both;10 reps;Seated   Assessment/Plan    PT  Assessment Patient needs continued PT services  PT Problem List Decreased activity tolerance;Decreased balance;Decreased mobility;Decreased knowledge of use of DME;Decreased safety awareness;Decreased knowledge of precautions       PT Treatment Interventions Gait training;Functional mobility training;Therapeutic activities;Therapeutic exercise;Balance training;Patient/family education;Stair training;DME instruction    PT Goals (Current goals can be found in the Care Plan section)  Acute Rehab PT Goals Patient Stated Goal: to go home PT Goal Formulation: With patient Time For Goal Achievement: 03/16/20 Potential to Achieve Goals: Good    Frequency Min 3X/week   Barriers to discharge        Co-evaluation               AM-PAC PT "6 Clicks" Mobility  Outcome Measure Help needed turning from your back to your side while in a flat bed without using bedrails?: A Lot Help needed moving from lying on your back to sitting on the side of a flat bed without using bedrails?: A Lot Help needed moving to and from a bed to a chair (including a wheelchair)?: A Little Help needed standing up from a chair using your arms (e.g., wheelchair or bedside chair)?: A Little Help needed to walk in hospital room?: A Little Help needed climbing 3-5 steps with a railing? : A Little 6 Click Score: 16    End of Session Equipment Utilized During Treatment: Gait belt;Oxygen Activity Tolerance: Patient limited by fatigue Patient left: in chair;with call bell/phone within reach;with chair alarm set Nurse Communication: Mobility status PT Visit Diagnosis: Muscle weakness (generalized) (M62.81)    Time: 6644-0347 PT Time Calculation (min) (ACUTE ONLY): 12 min   Charges:   PT Evaluation $PT Eval Moderate Complexity: 1 Mod          Olean Kristen Dennis,PT Acute Rehabilitation Services Pager:  [phone]  Office:  [phone]    Kristen Dennis 03/02/2020, 1:29 PM

## 2020-03-02 NOTE — Progress Notes (Signed)
PROGRESS NOTE    Kristen Dennis  OYD:[id number] DOB: [birth year] DOA: 02/28/2020 PCP: Claudean Severance, MD  Brief Narrative: 64 year old chronically ill female with history of COPD/chronic resp failure onO2-3L,  Chronic diastolic CHF RV failure, dementia, blindness, lives at home with her sister was brought to the ED with progressive shortness of breath for 2 weeks. -In the ED she was initially hypotensive, resolved with gentle IV fluids,, chest x-ray noted interstitial findings concerning for possible pneumonia, temp was 101, lactic acid was 5.7.  Assessment & Plan:   Sepsis Acute on chronic hypoxic respiratory failure Community-acquired pneumonia COPD exacerbation -Clinically improving, sepsis was present on admission -treated with ceftriaxone and azithromycin , afebrile, clinically improving -Blood cultures are negative, transitioned to prednisone and oral cefdinir today -SLP evaluation completed, no overt aspiration noted -Ambulate, PT OT, wean O2 down to 3 L -Discharge planning  Acute on chronic diastolic CHF Moderate pulmonary hypertension RV failure/cor pulmonale -Clinically appears  euvolemic -Continue oral Lasix  Acute renal failure -In the setting of sepsis and ACE inhibitor -Creatinine 2.0 on admission, baseline is 1.2 -Improving, hold lisinopril  Uncontrolled type 2 diabetes mellitus with hyperglycemia -Continue current regimen of Lantus and meal coverage -Titrate down as prednisone tapered  Dementia Blindness -Continue Aricept  DVT prophylaxis: Lovenox Code Status: Full code Family Communication: No family at bedside, left 2 messages for patient's sister 8/21 and another msg today 8/23 Disposition Plan:  Status is: Inpatient  Remains inpatient appropriate because:Inpatient level of care appropriate due to severity of illness  Dispo: The patient is from: Home              Anticipated d/c is to: Home              Anticipated d/c date is: 1-2 days               Patient currently is not medically stable to d/c.  Consultants:      Procedures:   Antimicrobials:    Subjective: -Feels better, breathing is improving, still has some cough Objective: Vitals:   03/02/20 0336 03/02/20 0734 03/02/20 0737 03/02/20 0845  BP:    134/67  Pulse:    80  Resp:    19  Temp:    (!) 97.5 F (36.4 C)  TempSrc:    Oral  SpO2:  93% 93% 96%  Weight: 92.4 kg     Height:        Intake/Output Summary (Last 24 hours) at 03/02/2020 1020 Last data filed at 03/02/2020 0900 Gross per 24 hour  Intake 1334.23 ml  Output 900 ml  Net 434.23 ml   Filed Weights   02/28/20 2145 03/01/20 0500 03/02/20 0336  Weight: 81.6 kg 90.3 kg 92.4 kg    Examination:  General exam: Obese pleasant female sitting up in bed, awake alert oriented to self and only partly to place, cognitive deficits noted CVS: S1-S2, regular rate rhythm Lungs: Improved air movement, no wheezes, few basilar rhonchi Abdomen: Soft, nontender, bowel sounds present Extremities: No edema Skin: No rashes on exposed skin Psychiatry: Poor insight and judgment    Data Reviewed:   CBC: Recent Labs  Lab 02/28/20 2200 02/29/20 0211 02/29/20 0601 03/01/20 0619 03/02/20 0657  WBC 11.5*  --  8.8 11.0* 14.0*  NEUTROABS 9.4*  --  7.9*  --   --   HGB 11.2* 11.9* 11.1* 10.6* 10.7*  HCT 39.1 35.0* 40.3 36.1 35.7*  MCV 78.0*  --  80.9 74.3* 74.5*  PLT 437*  --  320 391 417*   Basic Metabolic Panel: Recent Labs  Lab 02/28/20 2200 02/28/20 2200 02/28/20 2316 02/29/20 0158 02/29/20 0211 02/29/20 0601 03/01/20 0619 03/02/20 0657  NA 136   < >  --  138 138 136 138 139  K 5.8*   < >  --  4.8 4.5 4.6 4.6 4.2  CL 109  --   --  107  --  108 106 107  CO2 11*  --   --  16*  --  12* 20* 22  GLUCOSE 183*  --   --  201*  --  223* 364* 140*  BUN 21  --   --  23  --  26* 23 22  CREATININE 1.82*   < > 2.00* 1.77*  --  1.50* 1.18* 1.01*  CALCIUM 8.1*  --   --  8.4*  --  8.1* 8.7* 8.9   < > = values  in this interval not displayed.   GFR: Estimated Creatinine Clearance: 56.4 mL/min (A) (by C-G formula based on SCr of 1.01 mg/dL (H)). Liver Function Tests: Recent Labs  Lab 02/29/20 0601 02/29/20 0905 03/01/20 0619  AST QUANTITY NOT SUFFICIENT, UNABLE TO PERFORM TEST 27 21  ALT QUANTITY NOT SUFFICIENT, UNABLE TO PERFORM TEST 40 33  ALKPHOS QUANTITY NOT SUFFICIENT, UNABLE TO PERFORM TEST 124 114  BILITOT QUANTITY NOT SUFFICIENT, UNABLE TO PERFORM TEST 0.5 0.3  PROT 5.9*  --  6.2*  ALBUMIN 2.3*  --  2.3*   No results for input(s): LIPASE, AMYLASE in the last 168 hours. No results for input(s): AMMONIA in the last 168 hours. Coagulation Profile: Recent Labs  Lab 02/29/20 0601  INR 1.2   Cardiac Enzymes: No results for input(s): CKTOTAL, CKMB, CKMBINDEX, TROPONINI in the last 168 hours. BNP (last 3 results) No results for input(s): PROBNP in the last 8760 hours. HbA1C: No results for input(s): HGBA1C in the last 72 hours. CBG: Recent Labs  Lab 02/29/20 2042 03/01/20 1300 03/01/20 1646 03/01/20 2129 03/02/20 0701  GLUCAP 449* 361* 410* 239* 148*   Lipid Profile: No results for input(s): CHOL, HDL, LDLCALC, TRIG, CHOLHDL, LDLDIRECT in the last 72 hours. Thyroid Function Tests: No results for input(s): TSH, T4TOTAL, FREET4, T3FREE, THYROIDAB in the last 72 hours. Anemia Panel: No results for input(s): VITAMINB12, FOLATE, FERRITIN, TIBC, IRON, RETICCTPCT in the last 72 hours. Urine analysis:    Component Value Date/Time   COLORURINE AMBER (A) 02/29/2020 0842   APPEARANCEUR HAZY (A) 02/29/2020 0842   LABSPEC 1.023 02/29/2020 0842   PHURINE 5.0 02/29/2020 0842   GLUCOSEU NEGATIVE 02/29/2020 0842   HGBUR NEGATIVE 02/29/2020 0842   BILIRUBINUR NEGATIVE 02/29/2020 0842   KETONESUR 5 (A) 02/29/2020 0842   PROTEINUR 30 (A) 02/29/2020 0842   NITRITE NEGATIVE 02/29/2020 0842   LEUKOCYTESUR NEGATIVE 02/29/2020 0842   Sepsis  Labs: @LABRCNTIP (procalcitonin:4,lacticidven:4)  ) Recent Results (from the past 240 hour(s))  SARS Coronavirus 2 by RT PCR (hospital order, performed in [facility] hospital lab) Nasopharyngeal Nasopharyngeal Swab     Status: None   Collection Time: 02/28/20  9:53 PM   Specimen: Nasopharyngeal Swab  Result Value Ref Range Status   SARS Coronavirus 2 NEGATIVE NEGATIVE Final    Comment: (NOTE) SARS-CoV-2 target nucleic acids are NOT DETECTED.  The SARS-CoV-2 RNA is generally detectable in upper and lower respiratory specimens during the acute phase of infection. The lowest concentration of SARS-CoV-2 viral copies this assay can detect is 250 copies / mL. A negative result  does not preclude SARS-CoV-2 infection and should not be used as the sole basis for treatment or other patient management decisions.  A negative result may occur with improper specimen collection / handling, submission of specimen other than nasopharyngeal swab, presence of viral mutation(s) within the areas targeted by this assay, and inadequate number of viral copies (<250 copies / mL). A negative result must be combined with clinical observations, patient history, and epidemiological information.  Fact Sheet for Patients:   BoilerBrush.com.cy  Fact Sheet for Healthcare Providers: https://pope.com/  This test is not yet approved or  cleared by the [place] FDA and has been authorized for detection and/or diagnosis of SARS-CoV-2 by FDA under an Emergency Use Authorization (EUA).  This EUA will remain in effect (meaning this test can be used) for the duration of the COVID-19 declaration under Section 564(b)(1) of the Act, 21 U.S.C. section 360bbb-3(b)(1), unless the authorization is terminated or revoked sooner.  Performed at [facility], [address] [place], [place] [address]   Blood Culture (routine x 2)     Status: None (Preliminary result)    Collection Time: 02/28/20 10:55 PM   Specimen: BLOOD  Result Value Ref Range Status   Specimen Description BLOOD LEFT ANTECUBITAL  Final   Special Requests   Final    BOTTLES DRAWN AEROBIC AND ANAEROBIC Blood Culture results may not be optimal due to an inadequate volume of blood received in culture bottles   Culture   Final    NO GROWTH 3 DAYS Performed at [facility], [address] [place], [place] [address]    Report Status PENDING  Incomplete  Blood Culture (routine x 2)     Status: None (Preliminary result)   Collection Time: 02/29/20  2:23 AM   Specimen: BLOOD LEFT HAND  Result Value Ref Range Status   Specimen Description BLOOD LEFT HAND  Final   Special Requests   Final    BOTTLES DRAWN AEROBIC AND ANAEROBIC Blood Culture adequate volume   Culture   Final    NO GROWTH 2 DAYS Performed at [facility], [address], [place], [place] [address]    Report Status PENDING  Incomplete  Urine culture     Status: None   Collection Time: 02/29/20  8:36 AM   Specimen: In/Out Cath Urine  Result Value Ref Range Status   Specimen Description IN/OUT CATH URINE  Final   Special Requests NONE  Final   Culture   Final    NO GROWTH Performed at [facility], [address], [place], [place] [address]    Report Status 03/01/2020 FINAL  Final         Radiology Studies: DG CHEST PORT 1 VIEW  Result Date: 03/01/2020 CLINICAL DATA:  COPD EXAM: PORTABLE CHEST 1 VIEW COMPARISON:  02/28/2020 chest radiograph. FINDINGS: Stable cardiomediastinal silhouette with mild cardiomegaly. No pneumothorax. Probable stable trace bilateral pleural effusions. Persistent patchy hazy and linear diffuse parahilar opacities, most prominent in the lower lungs, not substantially changed. IMPRESSION: 1. Stable mild cardiomegaly with diffuse patchy hazy and linear parahilar opacities, most prominent in the lower lungs. Differential remains pulmonary edema or atypical infection. 2. Probable  stable trace bilateral pleural effusions. Electronically Signed   By: Delbert Phenix M.D.   On: 03/01/2020 16:33        Scheduled Meds:  azithromycin  500 mg Oral Daily   budesonide (PULMICORT) nebulizer solution  0.25 mg Nebulization BID  donepezil  5 mg Oral QHS   DULoxetine  60 mg Oral QHS   enoxaparin (LOVENOX) injection  40 mg Subcutaneous Q24H   furosemide  40 mg Oral Daily   insulin aspart  6 Units Subcutaneous TID WC   insulin glargine  25 Units Subcutaneous Daily   insulin glargine  40 Units Subcutaneous QHS   ipratropium-albuterol  3 mL Nebulization TID   predniSONE  50 mg Oral Q breakfast   pregabalin  100 mg Oral BID   Continuous Infusions:  sodium chloride 250 mL (03/01/20 2225)   cefTRIAXone (ROCEPHIN)  IV 2 g (03/01/20 2226)     LOS: 2 days    Time spent:1425min  Zannie CovePreetha Coree Brame, MD [facility]  03/02/2020, 10:20 AM

## 2020-03-03 ENCOUNTER — Emergency Department (HOSPITAL_COMMUNITY): Payer: Medicare PPO

## 2020-03-03 ENCOUNTER — Inpatient Hospital Stay (HOSPITAL_COMMUNITY)
Admission: EM | Admit: 2020-03-03 | Discharge: 2020-03-11 | DRG: 871 | Disposition: E | Payer: Medicare PPO | Attending: Critical Care Medicine | Admitting: Critical Care Medicine

## 2020-03-03 ENCOUNTER — Other Ambulatory Visit: Payer: Self-pay

## 2020-03-03 DIAGNOSIS — K7200 Acute and subacute hepatic failure without coma: Secondary | ICD-10-CM | POA: Diagnosis present

## 2020-03-03 DIAGNOSIS — T445X5A Adverse effect of predominantly beta-adrenoreceptor agonists, initial encounter: Secondary | ICD-10-CM | POA: Diagnosis present

## 2020-03-03 DIAGNOSIS — R7989 Other specified abnormal findings of blood chemistry: Secondary | ICD-10-CM

## 2020-03-03 DIAGNOSIS — Z87891 Personal history of nicotine dependence: Secondary | ICD-10-CM

## 2020-03-03 DIAGNOSIS — R0902 Hypoxemia: Secondary | ICD-10-CM

## 2020-03-03 DIAGNOSIS — J13 Pneumonia due to Streptococcus pneumoniae: Secondary | ICD-10-CM | POA: Diagnosis present

## 2020-03-03 DIAGNOSIS — J9621 Acute and chronic respiratory failure with hypoxia: Secondary | ICD-10-CM | POA: Diagnosis present

## 2020-03-03 DIAGNOSIS — I248 Other forms of acute ischemic heart disease: Secondary | ICD-10-CM | POA: Diagnosis present

## 2020-03-03 DIAGNOSIS — I468 Cardiac arrest due to other underlying condition: Secondary | ICD-10-CM | POA: Diagnosis present

## 2020-03-03 DIAGNOSIS — R042 Hemoptysis: Secondary | ICD-10-CM

## 2020-03-03 DIAGNOSIS — E875 Hyperkalemia: Secondary | ICD-10-CM | POA: Diagnosis present

## 2020-03-03 DIAGNOSIS — I50813 Acute on chronic right heart failure: Secondary | ICD-10-CM | POA: Diagnosis present

## 2020-03-03 DIAGNOSIS — E874 Mixed disorder of acid-base balance: Secondary | ICD-10-CM | POA: Diagnosis present

## 2020-03-03 DIAGNOSIS — Z8661 Personal history of infections of the central nervous system: Secondary | ICD-10-CM

## 2020-03-03 DIAGNOSIS — Z01818 Encounter for other preprocedural examination: Secondary | ICD-10-CM

## 2020-03-03 DIAGNOSIS — E1165 Type 2 diabetes mellitus with hyperglycemia: Secondary | ICD-10-CM | POA: Diagnosis present

## 2020-03-03 DIAGNOSIS — A403 Sepsis due to Streptococcus pneumoniae: Secondary | ICD-10-CM | POA: Diagnosis not present

## 2020-03-03 DIAGNOSIS — J440 Chronic obstructive pulmonary disease with acute lower respiratory infection: Secondary | ICD-10-CM | POA: Diagnosis present

## 2020-03-03 DIAGNOSIS — D72829 Elevated white blood cell count, unspecified: Secondary | ICD-10-CM

## 2020-03-03 DIAGNOSIS — R6521 Severe sepsis with septic shock: Secondary | ICD-10-CM | POA: Diagnosis present

## 2020-03-03 DIAGNOSIS — Z9981 Dependence on supplemental oxygen: Secondary | ICD-10-CM

## 2020-03-03 DIAGNOSIS — E1169 Type 2 diabetes mellitus with other specified complication: Secondary | ICD-10-CM | POA: Diagnosis present

## 2020-03-03 DIAGNOSIS — F0390 Unspecified dementia without behavioral disturbance: Secondary | ICD-10-CM | POA: Diagnosis present

## 2020-03-03 DIAGNOSIS — E11649 Type 2 diabetes mellitus with hypoglycemia without coma: Secondary | ICD-10-CM | POA: Diagnosis not present

## 2020-03-03 DIAGNOSIS — N179 Acute kidney failure, unspecified: Secondary | ICD-10-CM | POA: Diagnosis present

## 2020-03-03 DIAGNOSIS — G931 Anoxic brain damage, not elsewhere classified: Secondary | ICD-10-CM | POA: Diagnosis present

## 2020-03-03 DIAGNOSIS — Z8673 Personal history of transient ischemic attack (TIA), and cerebral infarction without residual deficits: Secondary | ICD-10-CM

## 2020-03-03 DIAGNOSIS — Z20822 Contact with and (suspected) exposure to covid-19: Secondary | ICD-10-CM | POA: Diagnosis present

## 2020-03-03 DIAGNOSIS — Z794 Long term (current) use of insulin: Secondary | ICD-10-CM

## 2020-03-03 DIAGNOSIS — R34 Anuria and oliguria: Secondary | ICD-10-CM | POA: Diagnosis not present

## 2020-03-03 DIAGNOSIS — I469 Cardiac arrest, cause unspecified: Secondary | ICD-10-CM | POA: Diagnosis not present

## 2020-03-03 DIAGNOSIS — I252 Old myocardial infarction: Secondary | ICD-10-CM

## 2020-03-03 DIAGNOSIS — Z8249 Family history of ischemic heart disease and other diseases of the circulatory system: Secondary | ICD-10-CM

## 2020-03-03 DIAGNOSIS — I152 Hypertension secondary to endocrine disorders: Secondary | ICD-10-CM | POA: Diagnosis present

## 2020-03-03 DIAGNOSIS — Z66 Do not resuscitate: Secondary | ICD-10-CM | POA: Diagnosis not present

## 2020-03-03 DIAGNOSIS — Z79899 Other long term (current) drug therapy: Secondary | ICD-10-CM

## 2020-03-03 DIAGNOSIS — I272 Pulmonary hypertension, unspecified: Secondary | ICD-10-CM | POA: Diagnosis present

## 2020-03-03 DIAGNOSIS — J9622 Acute and chronic respiratory failure with hypercapnia: Secondary | ICD-10-CM | POA: Diagnosis present

## 2020-03-03 DIAGNOSIS — I472 Ventricular tachycardia: Secondary | ICD-10-CM | POA: Diagnosis present

## 2020-03-03 DIAGNOSIS — J441 Chronic obstructive pulmonary disease with (acute) exacerbation: Secondary | ICD-10-CM | POA: Diagnosis present

## 2020-03-03 DIAGNOSIS — H548 Legal blindness, as defined in USA: Secondary | ICD-10-CM | POA: Diagnosis present

## 2020-03-03 LAB — CBC WITH DIFFERENTIAL/PLATELET
Abs Immature Granulocytes: 3.85 10*3/uL — ABNORMAL HIGH (ref 0.00–0.07)
Basophils Absolute: 0.3 10*3/uL — ABNORMAL HIGH (ref 0.0–0.1)
Basophils Relative: 1 %
Eosinophils Absolute: 0.1 10*3/uL (ref 0.0–0.5)
Eosinophils Relative: 0 %
HCT: 51.8 % — ABNORMAL HIGH (ref 36.0–46.0)
Hemoglobin: 14.2 g/dL (ref 12.0–15.0)
Immature Granulocytes: 8 %
Lymphocytes Relative: 12 %
Lymphs Abs: 5.9 10*3/uL — ABNORMAL HIGH (ref 0.7–4.0)
MCH: 21.9 pg — ABNORMAL LOW (ref 26.0–34.0)
MCHC: 27.4 g/dL — ABNORMAL LOW (ref 30.0–36.0)
MCV: 80.1 fL (ref 80.0–100.0)
Monocytes Absolute: 1.8 10*3/uL — ABNORMAL HIGH (ref 0.1–1.0)
Monocytes Relative: 4 %
Neutro Abs: 35.9 10*3/uL — ABNORMAL HIGH (ref 1.7–7.7)
Neutrophils Relative %: 75 %
Platelets: 519 10*3/uL — ABNORMAL HIGH (ref 150–400)
RBC: 6.47 MIL/uL — ABNORMAL HIGH (ref 3.87–5.11)
RDW: 22.9 % — ABNORMAL HIGH (ref 11.5–15.5)
WBC: 47.9 10*3/uL — ABNORMAL HIGH (ref 4.0–10.5)
nRBC: 2 % — ABNORMAL HIGH (ref 0.0–0.2)

## 2020-03-03 LAB — I-STAT ARTERIAL BLOOD GAS, ED
Acid-base deficit: 15 mmol/L — ABNORMAL HIGH (ref 0.0–2.0)
Bicarbonate: 19.6 mmol/L — ABNORMAL LOW (ref 20.0–28.0)
Calcium, Ion: 1.16 mmol/L (ref 1.15–1.40)
HCT: 48 % — ABNORMAL HIGH (ref 36.0–46.0)
Hemoglobin: 16.3 g/dL — ABNORMAL HIGH (ref 12.0–15.0)
O2 Saturation: 76 %
Potassium: 4.4 mmol/L (ref 3.5–5.1)
Sodium: 139 mmol/L (ref 135–145)
TCO2: 22 mmol/L (ref 22–32)
pCO2 arterial: 93.3 mmHg (ref 32.0–48.0)
pH, Arterial: 6.93 — CL (ref 7.350–7.450)
pO2, Arterial: 67 mmHg — ABNORMAL LOW (ref 83.0–108.0)

## 2020-03-03 LAB — APTT: aPTT: 41 seconds — ABNORMAL HIGH (ref 24–36)

## 2020-03-03 LAB — I-STAT CHEM 8, ED
BUN: 34 mg/dL — ABNORMAL HIGH (ref 8–23)
Calcium, Ion: 1.1 mmol/L — ABNORMAL LOW (ref 1.15–1.40)
Chloride: 108 mmol/L (ref 98–111)
Creatinine, Ser: 1.4 mg/dL — ABNORMAL HIGH (ref 0.44–1.00)
Glucose, Bld: 288 mg/dL — ABNORMAL HIGH (ref 70–99)
HCT: 52 % — ABNORMAL HIGH (ref 36.0–46.0)
Hemoglobin: 17.7 g/dL — ABNORMAL HIGH (ref 12.0–15.0)
Potassium: 4.6 mmol/L (ref 3.5–5.1)
Sodium: 140 mmol/L (ref 135–145)
TCO2: 21 mmol/L — ABNORMAL LOW (ref 22–32)

## 2020-03-03 LAB — GLUCOSE, CAPILLARY
Glucose-Capillary: 99 mg/dL (ref 70–99)
Glucose-Capillary: 161 mg/dL — ABNORMAL HIGH (ref 70–99)

## 2020-03-03 LAB — LACTIC ACID, PLASMA: Lactic Acid, Venous: 7.7 mmol/L (ref 0.5–1.9)

## 2020-03-03 LAB — PROTIME-INR
INR: 1.5 — ABNORMAL HIGH (ref 0.8–1.2)
Prothrombin Time: 17.9 seconds — ABNORMAL HIGH (ref 11.4–15.2)

## 2020-03-03 LAB — BRAIN NATRIURETIC PEPTIDE: B Natriuretic Peptide: 485.9 pg/mL — ABNORMAL HIGH (ref 0.0–100.0)

## 2020-03-03 MED ORDER — ETOMIDATE 2 MG/ML IV SOLN
INTRAVENOUS | Status: AC | PRN
  Administered 2020-03-03: 15 mg via INTRAVENOUS

## 2020-03-03 MED ORDER — CEFDINIR 300 MG PO CAPS: 300.0000 mg | ORAL_CAPSULE | Freq: Two times a day (BID) | ORAL | 0 refills | Status: AC

## 2020-03-03 MED ORDER — AMIODARONE HCL IN DEXTROSE 360-4.14 MG/200ML-% IV SOLN
30.0000 mg/h | INTRAVENOUS | Status: DC
  Filled 2020-03-03: qty 200

## 2020-03-03 MED ORDER — AMIODARONE HCL IN DEXTROSE 360-4.14 MG/200ML-% IV SOLN
60.0000 mg/h | INTRAVENOUS | Status: DC
  Administered 2020-03-03: 60 mg/h via INTRAVENOUS

## 2020-03-03 MED ORDER — SUCCINYLCHOLINE CHLORIDE 20 MG/ML IJ SOLN
INTRAMUSCULAR | Status: AC | PRN
  Administered 2020-03-03: 100 mg via INTRAVENOUS

## 2020-03-03 MED ORDER — SODIUM CHLORIDE 0.9 % IV SOLN
2.0000 g | Freq: Once | INTRAVENOUS | Status: AC
  Administered 2020-03-04: 2 g via INTRAVENOUS
  Filled 2020-03-03: qty 2

## 2020-03-03 MED ORDER — PREDNISONE 50 MG PO TABS
50.0000 mg | ORAL_TABLET | Freq: Every day | ORAL | Status: DC
  Administered 2020-03-03: 50 mg via ORAL
  Filled 2020-03-03: qty 1

## 2020-03-03 MED ORDER — VANCOMYCIN HCL IN DEXTROSE 1-5 GM/200ML-% IV SOLN: 1000.0000 mg | Freq: Once | INTRAVENOUS | Status: DC

## 2020-03-03 MED ORDER — PREDNISONE 10 MG PO TABS: 10.0000 mg | ORAL_TABLET | Freq: Every day | ORAL | 0 refills | Status: AC

## 2020-03-03 MED ORDER — EPINEPHRINE 1 MG/10ML IJ SOSY
PREFILLED_SYRINGE | INTRAMUSCULAR | Status: AC | PRN
  Administered 2020-03-03: 1 mg via INTRAVENOUS

## 2020-03-03 MED ORDER — MIDAZOLAM HCL 2 MG/2ML IJ SOLN
4.0000 mg | Freq: Once | INTRAMUSCULAR | Status: AC
  Administered 2020-03-03: 4 mg via INTRAVENOUS

## 2020-03-03 MED ORDER — EPINEPHRINE HCL 5 MG/250ML IV SOLN IN NS
0.5000 ug/min | INTRAVENOUS | Status: DC
  Administered 2020-03-03: 10 ug/min via INTRAVENOUS
  Administered 2020-03-04 – 2020-03-07 (×19): 20 ug/min via INTRAVENOUS
  Filled 2020-03-03 (×22): qty 250

## 2020-03-03 MED ORDER — EPINEPHRINE 1 MG/10ML IJ SOSY
PREFILLED_SYRINGE | INTRAMUSCULAR | Status: AC | PRN
  Administered 2020-03-03: 1 via INTRAVENOUS

## 2020-03-03 MED ORDER — DEXTROSE 5 % IV SOLN
INTRAVENOUS | Status: AC | PRN
  Administered 2020-03-03: 150 mg via INTRAVENOUS

## 2020-03-03 MED ORDER — EPINEPHRINE 0.1 MG/10ML (10 MCG/ML) SYRINGE FOR IV PUSH (FOR BLOOD PRESSURE SUPPORT)
PREFILLED_SYRINGE | INTRAVENOUS | Status: AC | PRN
  Administered 2020-03-03: 40 ug via INTRAVENOUS

## 2020-03-03 MED ORDER — VANCOMYCIN HCL 2000 MG/400ML IV SOLN
2000.0000 mg | Freq: Once | INTRAVENOUS | Status: AC
  Administered 2020-03-04: 2000 mg via INTRAVENOUS
  Filled 2020-03-03 (×2): qty 400

## 2020-03-03 NOTE — Code Documentation (Signed)
Pt in Vtach w/ pulse - epi infusion paused

## 2020-03-03 NOTE — Care Management Important Message (Signed)
Important Message  Patient Details  Name: Kristen Dennis MRN: [id number] Date of Birth: January 14, 1956   Medicare Important Message Given:  Yes     Renie Ora 03/10/2020, 1:59 PM

## 2020-03-03 NOTE — Plan of Care (Signed)

## 2020-03-03 NOTE — Progress Notes (Signed)
Chaplain responded to call from security. Family member escorted to Consult room for Trauma A post-CPR pt.  Chaplain sat with pt's sister, Kristen Dennis, while doctor came to speak with her about patient's condition.  Chaplain offered ministry of presence and support. Ms. Kristen Dennis had been with pt and witnessed the CPR in process before admittance. "It was awful."  Ms. Kristen Dennis expressed being confused as to why pt had been released from hospital only hours earlier."Why did they let her come home?" Chaplain continued to discuss care options with sister---comfort care or aggressive care.Sister affirmed she was sister's POA.   Chaplain rec'd pt's sister's telephone number so that she could be reached.Kristen Dennis   [phone]  Chaplain will continue to be available.  Son of patient heading to the hospital now from [place]. May arrive in an hour.  Rev. Lynnell Chad Pager [phone]

## 2020-03-03 NOTE — H&P (Signed)
[name], MRN:  [id number], DOB:  [birth year], LOS: 0 ADMISSION DATE:  02/11/2020, CONSULTATION DATE: 03/04/2020 REFERRING MD: Redge Gainer, ED, CHIEF COMPLAINT: Status post arrest  Brief History   Patient is a 64 year old female brought to the emergency room after EMTs found her in PEA arrest at home.  History of present illness   Patient is a 64 year old female with a history of COPD on 3 L oxygen at home pulmonary hypertension RV failure dementia and a history of blindness who was admitted to the hospital on 02/29/2020 with streptococcal pneumonia and early sepsis.  She showed serial clinical improvement and was discharged earlier today.  Her sister describes her to have been in good spirits and had gone home to her sister's home.  While earlier this evening she gone to the bathroom prior to retiring for the evening and the sister had left her alone she says for about a few minutes and she returned to find her passed out on the toilet.  She initiated chest compressions on the scene and EMT arrived and resuscitated her there.  Her King airway was replaced in the emergency room.  Chest x-ray in the ER shows diffuse bilateral infiltrates lower lobe predominant right greater than left.  She was found in the emergency  room to be profoundly acidotic with a pH of 6.9 PCO2 of 93 PO2 of 67.  Her blood glucose was 288 creatinine 1.4.  Hemoglobin 17.  EKG shows previous anterior lateral infarct.  White count is 47,000 platelet count 519, BNP is 45.  Past Medical History    Asthma   . Diabetes mellitus without complication (HCC)   . Encephalitis   . Hypertension   . Legally blind   . Renal disease   . Stroke [facility])    2011     Significant Hospital Events   Intubation central line placement admission to the ICU 03/04/2020  Consults:  PCCM  Procedures:  As above  Significant Diagnostic Tests:  NA  Micro Data:  Previous streptococcal antigen in urine positive on recent  admission  Antimicrobials:  We will continue antibiotics she had in-house Rocephin and Zithromax.  Interim history/subjective:  NA  Objective   Blood pressure (!) 161/87, pulse (!) 104, temperature 98 F (36.7 C), resp. rate 19, height 5\' 2"  (1.575 m), last menstrual period 05/03/2012, SpO2 (!) 29 %.    Vent Mode: PRVC FiO2 (%):  [100 %] 100 % Set Rate:  [18 bmp] 18 bmp Vt Set:  [400 mL] 400 mL PEEP:  [5 cmH20-14 cmH20] 14 cmH20 Plateau Pressure:  [28 cmH20] 28 cmH20  No intake or output data in the 24 hours ending 02/21/2020 2358 There were no vitals filed for this visit.  Examination: General: [name] intubated sedated HENT: Within normal limits Lungs: Coarse bilaterally Cardiovascular: Regular rate and rhythm Abdomen: Benign bowel sounds positive Extremities: Thin within normal limits Neuro: Sedated nonfocal obtunded GU: N/A  Resolved Hospital Problem list   NA  Assessment & Plan:  1.  Status post PEA arrest: Patient coded once again in the emergency room.  Discussions ongoing with family in regards to aggressiveness of care.  We will continue current therapy.  Central line will be placed for pressors as needed.  2.  Hyperglycemia: Will controlled with sliding scale insulin.  3.  History of COPD continue duo nebs, ventilator management  4.  Recent admission for pneumonia: Continue current antibiotics  5.  History of CVA  Case discussed with patient's  sister and son.  I did describe the seriousness of her current situation and the fact that it is extremely unlikely she will have any meaningful recovery.  Best practice:  Diet: N.p.o. Pain/Anxiety/Delirium protocol (if indicated): Continuous IV sedation as needed VAP protocol (if indicated): Yes DVT prophylaxis: SCD/Lovenox GI prophylaxis: Yes Glucose control: Sliding scale Mobility: Bedrest Code Status: Currently full code in discussion with family Family Communication: As above Disposition: To ICU for  further care  Labs   CBC: Recent Labs  Lab 02/28/20 2200 02/29/20 0211 02/29/20 0601 02/29/20 0601 03/01/20 9242 03/02/20 0657 03/01/2020 2205 03/01/2020 2234 02/27/2020 2340  WBC 11.5*  --  8.8  --  11.0* 14.0* 47.9*  --   --   NEUTROABS 9.4*  --  7.9*  --   --   --  35.9*  --   --   HGB 11.2*   < > 11.1*   < > 10.6* 10.7* 14.2 16.3* 17.7*  HCT 39.1   < > 40.3   < > 36.1 35.7* 51.8* 48.0* 52.0*  MCV 78.0*  --  80.9  --  74.3* 74.5* 80.1  --   --   PLT 437*  --  320  --  391 417* 519*  --   --    < > = values in this interval not displayed.    Basic Metabolic Panel: Recent Labs  Lab 02/28/20 2200 02/28/20 2316 02/29/20 0158 02/29/20 0211 02/29/20 0601 03/01/20 6834 03/02/20 0657 02/21/2020 2234 02/19/2020 2340  NA 136   < > 138   < > 136 138 139 139 140  K 5.8*   < > 4.8   < > 4.6 4.6 4.2 4.4 4.6  CL 109   < > 107  --  108 106 107  --  108  CO2 11*  --  16*  --  12* 20* 22  --   --   GLUCOSE 183*   < > 201*  --  223* 364* 140*  --  288*  BUN 21   < > 23  --  26* 23 22  --  34*  CREATININE 1.82*   < > 1.77*  --  1.50* 1.18* 1.01*  --  1.40*  CALCIUM 8.1*  --  8.4*  --  8.1* 8.7* 8.9  --   --    < > = values in this interval not displayed.   GFR: Estimated Creatinine Clearance: 42.6 mL/min (A) (by C-G formula based on SCr of 1.4 mg/dL (H)). Recent Labs  Lab 02/28/20 2200 02/28/20 2200 02/29/20 0037 02/29/20 0601 02/29/20 1326 03/01/20 0619 03/02/20 0657 03/05/2020 2205  PROCALCITON  --   --   --   --  0.18  --   --   --   WBC 11.5*   < >  --  8.8  --  11.0* 14.0* 47.9*  LATICACIDVEN 5.7*  --  5.7* 2.0*  --   --   --  7.7*   < > = values in this interval not displayed.    Liver Function Tests: Recent Labs  Lab 02/29/20 0601 02/29/20 0905 03/01/20 0619  AST QUANTITY NOT SUFFICIENT, UNABLE TO PERFORM TEST 27 21  ALT QUANTITY NOT SUFFICIENT, UNABLE TO PERFORM TEST 40 33  ALKPHOS QUANTITY NOT SUFFICIENT, UNABLE TO PERFORM TEST 124 114  BILITOT QUANTITY NOT  SUFFICIENT, UNABLE TO PERFORM TEST 0.5 0.3  PROT 5.9*  --  6.2*  ALBUMIN 2.3*  --  2.3*  No results for input(s): LIPASE, AMYLASE in the last 168 hours. No results for input(s): AMMONIA in the last 168 hours.  ABG    Component Value Date/Time   PHART 6.930 (LL) 09/15/2019 2234   PCO2ART 93.3 (HH) 09/15/2019 2234   PO2ART 67 (L) 09/15/2019 2234   HCO3 19.6 (L) 09/15/2019 2234   TCO2 21 (L) 09/15/2019 2340   ACIDBASEDEF 15.0 (H) 09/15/2019 2234   O2SAT 76.0 09/15/2019 2234     Coagulation Profile: Recent Labs  Lab 02/29/20 0601 2020-02-12 2205  INR 1.2 1.5*    Cardiac Enzymes: No results for input(s): CKTOTAL, CKMB, CKMBINDEX, TROPONINI in the last 168 hours.  HbA1C: Hgb A1c MFr Bld  Date/Time Value Ref Range Status  01/19/2020 02:25 AM 5.7 (H) 4.8 - 5.6 % Final    Comment:    (NOTE) Pre diabetes:          5.7%-6.4%  Diabetes:              >6.4%  Glycemic control for   <7.0% adults with diabetes     CBG: Recent Labs  Lab 03/02/20 1200 03/02/20 1608 03/02/20 2100 2020-02-12 0614 2020-02-12 1123  GLUCAP 203* 183* 248* 99 161*    Review of Systems:   Unable to obtain.  Patient sedated and intubated  Past Medical History  She,  has a past medical history of Asthma, Diabetes mellitus without complication (HCC), Encephalitis, Hypertension, Legally blind, Renal disease, and Stroke (HCC).   Surgical History    Past Surgical History:  Procedure Laterality Date  . CHOLECYSTECTOMY       Social History   reports that she has quit smoking. She has never used smokeless tobacco. She reports that she does not drink alcohol and does not use drugs.   Family History   Her family history includes Cancer in her mother; Heart failure in her father.   Allergies No Known Allergies   Home Medications  Prior to Admission medications   Medication Sig Start Date End Date Taking? Authorizing Provider  OXYGEN Inhale 2 L/min into the lungs.   Yes [provider]   acetaminophen (TYLENOL) 500 MG tablet Take 500-1,000 mg by mouth every 6 (six) hours as needed for mild pain or headache.    [provider]  albuterol (PROVENTIL HFA;VENTOLIN HFA) 108 (90 Base) MCG/ACT inhaler Inhale 2 puffs into the lungs every 6 (six) hours as needed for wheezing or shortness of breath. 07/18/17   Maxie BarbBhandari, Dron Prasad, MD  benzonatate (TESSALON) 100 MG capsule Take 100 mg by mouth 3 (three) times daily as needed for cough.    [provider]  cefdinir (OMNICEF) 300 MG capsule Take 1 capsule (300 mg total) by mouth every 12 (twelve) hours for 4 days. March 01, 2020 02/13/2020  Zannie CoveJoseph, Preetha, MD  citalopram (CELEXA) 10 MG tablet Take 10 mg by mouth at bedtime.    [provider]  Dextromethorphan-guaiFENesin (MUCINEX FAST-MAX DM MAX) 5-100 MG/5ML LIQD Take 5-10 mLs by mouth every 4 (four) hours as needed (for coughing).     [provider]  donepezil (ARICEPT) 5 MG tablet Take 5 mg by mouth at bedtime. 05/16/17   [provider]  DULoxetine (CYMBALTA) 60 MG capsule Take 60 mg by mouth at bedtime. Patient not taking: Reported on 02/29/2020 03/14/17   [provider]  ergocalciferol (VITAMIN D2) 1.25 MG (50000 UT) capsule Take 50,000 Units by mouth every Monday.  01/09/20   [provider]  fluticasone furoate-vilanterol (BREO ELLIPTA) 100-25  MCG/INH AEPB Inhale 1 puff into the lungs daily. 12/29/19   [provider]  furosemide (LASIX) 40 MG tablet Take 40 mg by mouth in the morning.     [provider]  hydrALAZINE (APRESOLINE) 50 MG tablet Take 50 mg by mouth 3 (three) times daily. 07/10/17   [provider]  insulin glargine (LANTUS SOLOSTAR) 100 UNIT/ML Solostar Pen Inject 50 Units into the skin at bedtime.  06/03/19   [provider]  lisinopril (PRINIVIL,ZESTRIL) 10 MG tablet Take 10 mg by mouth every evening.  07/10/17   [provider]  LORazepam (ATIVAN) 0.5 MG tablet Take 0.5 mg by  mouth daily as needed for anxiety.    [provider]  metFORMIN (GLUCOPHAGE) 500 MG tablet Take 1,000 mg by mouth 2 (two) times daily. 05/18/17   [provider]  metoprolol tartrate (LOPRESSOR) 50 MG tablet Take 50 mg by mouth 2 (two) times daily. 07/10/17   [provider]  montelukast (SINGULAIR) 10 MG tablet Take 10 mg by mouth at bedtime. 07/10/17   [provider]  omeprazole (PRILOSEC) 20 MG capsule Take 20 mg by mouth daily before supper.  04/14/17   [provider]  potassium chloride SA (K-DUR,KLOR-CON) 20 MEQ tablet Take 20 mEq by mouth in the morning.     [provider]  predniSONE (DELTASONE) 10 MG tablet Take 1-4 tablets (10-40 mg total) by mouth daily with breakfast. 40mg  daily for 2days then 30mg  daily for 2days then 20mg  daily for 2days, 10mg  daily for 2days, then STOP March 11, 2020   , MD  pregabalin (LYRICA) 100 MG capsule Take 1 capsule (100 mg total) by mouth 3 (three) times daily. 09/22/17   , PA-C  sitaGLIPtin (JANUVIA) 25 MG tablet Take 25 mg by mouth daily.  12/24/18   [provider]     Critical care time: Over 35 minutes spent bedside evaluation chart review and critical care planning

## 2020-03-03 NOTE — ED Triage Notes (Signed)
Pt presents to ED post CPR. Pt had witnessed arrest by family. Pt found by EMS in PEA, agonal resp. EMS did 5-84m CPR, 1 epi, no shocks. Pt had ROSC and 70m afterwards began becoming alert and blinking. Pt intubated by EMS, blood in tube. EMS gien 5mg  versed, 50 fent, IO in R tib

## 2020-03-03 NOTE — Progress Notes (Signed)
D/C instructions given and reviewed. Questions answered and encouraged to call with any further concerns. Family at bedside to transport home.

## 2020-03-03 NOTE — ED Provider Notes (Signed)
[facility] EMERGENCY DEPARTMENT Provider Note   CSN: [id number] Arrival date & time: 02/14/2020  2143     History Chief Complaint  Patient presents with  . Post CPR    Kristen Dennis is a 64 y.o. female.  Presents to ER postarrest.  History limited due to acuity.  History obtained via report, chart review, discussion with sister.  Patient was recently discharged from the hospital, sister reports that she was in good spirits, but this evening she started complaining of difficulty in breathing, then became unresponsive.  Upon EMS arrival, they report patient was having agonal breathing.  Started CPR, 1 epi, no shocks, ROSC.  King airway placed.  Blood noted from [facility] airway.  IO in right tibia functioning well. HPI     Past Medical History:  Diagnosis Date  . Asthma   . Diabetes mellitus without complication ([place])   . Encephalitis   . Hypertension   . Legally blind   . Renal disease   . Stroke [facility])    2011    Patient Active Problem List   Diagnosis Date Noted  . Acute respiratory failure with hypoxia ([place]) 02/29/2020  . ARF (acute renal failure) ([place]) 02/29/2020  . CAP (community acquired pneumonia) 02/29/2020  . Sepsis ([place]) 02/29/2020  . Acute on chronic diastolic CHF (congestive heart failure) ([place]) 01/19/2020  . COPD (chronic obstructive pulmonary disease) ([place]) 01/19/2020  . Dementia ([place]) 01/19/2020  . Leukocytosis 01/19/2020  . Respiratory failure, acute ([place]) 01/19/2020  . Asthma exacerbation 07/15/2017  . Asthma exacerbation, mild 07/15/2017  . Hypertension associated with diabetes ([place])   . Diabetes mellitus without complication ([place])   . Asthma   . Renal disease     Past Surgical History:  Procedure Laterality Date  . CHOLECYSTECTOMY       OB History   No obstetric history on file.     Family History  Problem Relation Age of Onset  . Cancer Mother   . Heart failure Father     Social History   Tobacco Use  . Smoking  status: Former Research scientist (life sciences)  . Smokeless tobacco: Never Used  Vaping Use  . Vaping Use: Never used  Substance Use Topics  . Alcohol use: No  . Drug use: No    Home Medications Prior to Admission medications   Medication Sig Start Date End Date Taking? Authorizing Provider  OXYGEN Inhale 2 L/min into the lungs.   Yes [provider]  acetaminophen (TYLENOL) 500 MG tablet Take 500-1,000 mg by mouth every 6 (six) hours as needed for mild pain or headache.    [provider]  albuterol (PROVENTIL HFA;VENTOLIN HFA) 108 (90 Base) MCG/ACT inhaler Inhale 2 puffs into the lungs every 6 (six) hours as needed for wheezing or shortness of breath. 07/18/17   Rosita Fire, MD  benzonatate (TESSALON) 100 MG capsule Take 100 mg by mouth 3 (three) times daily as needed for cough.    [provider]  cefdinir (OMNICEF) 300 MG capsule Take 1 capsule (300 mg total) by mouth every 12 (twelve) hours for 4 days.  Apr 06, 2020  Domenic Polite, MD  citalopram (CELEXA) 10 MG tablet Take 10 mg by mouth at bedtime.    [provider]  Dextromethorphan-guaiFENesin ([place] FAST-MAX DM MAX) 5-100 MG/5ML LIQD Take 5-10 mLs by mouth every 4 (four) hours as needed (for coughing).     [provider]  donepezil (ARICEPT) 5 MG tablet Take 5 mg by mouth at bedtime. 05/16/17  [provider]  DULoxetine (CYMBALTA) 60 MG capsule Take 60 mg by mouth at bedtime. Patient not taking: Reported on 02/29/2020 03/14/17   [provider]  ergocalciferol (VITAMIN D2) 1.25 MG (50000 UT) capsule Take 50,000 Units by mouth every Monday.  01/09/20   [provider]  fluticasone furoate-vilanterol (BREO ELLIPTA) 100-25 MCG/INH AEPB Inhale 1 puff into the lungs daily. 12/29/19   [provider]  furosemide (LASIX) 40 MG tablet Take 40 mg by mouth in the morning.     [provider]  hydrALAZINE (APRESOLINE) 50 MG tablet Take 50 mg by mouth 3 (three) times  daily. 07/10/17   [provider]  insulin glargine (LANTUS SOLOSTAR) 100 UNIT/ML Solostar Pen Inject 50 Units into the skin at bedtime.  06/03/19   [provider]  lisinopril (PRINIVIL,ZESTRIL) 10 MG tablet Take 10 mg by mouth every evening.  07/10/17   [provider]  LORazepam (ATIVAN) 0.5 MG tablet Take 0.5 mg by mouth daily as needed for anxiety.    [provider]  metFORMIN (GLUCOPHAGE) 500 MG tablet Take 1,000 mg by mouth 2 (two) times daily. 05/18/17   [provider]  metoprolol tartrate (LOPRESSOR) 50 MG tablet Take 50 mg by mouth 2 (two) times daily. 07/10/17   [provider]  montelukast (SINGULAIR) 10 MG tablet Take 10 mg by mouth at bedtime. 07/10/17   [provider]  omeprazole (PRILOSEC) 20 MG capsule Take 20 mg by mouth daily before supper.  04/14/17   [provider]  potassium chloride SA (K-DUR,KLOR-CON) 20 MEQ tablet Take 20 mEq by mouth in the morning.     [provider]  predniSONE (DELTASONE) 10 MG tablet Take 1-4 tablets (10-40 mg total) by mouth daily with breakfast. 72m daily for 2days then 31mdaily for 2days then 2062maily for 2days, 21m53mily for 2days, then STOP 02/23/2020   JoseDomenic Polite  pregabalin (LYRICA) 100 MG capsule Take 1 capsule (100 mg total) by mouth 3 (three) times daily. 09/22/17   TranDomenic Moras-C  sitaGLIPtin (JANUVIA) 25 MG tablet Take 25 mg by mouth daily.  12/24/18   [provider]    Allergies    Patient has no known allergies.  Review of Systems   Review of Systems  Unable to perform ROS: Acuity of condition    Physical Exam Updated Vital Signs BP (!) 80/58   Pulse (!) 113   Temp 97.8 F (36.6 C)   Resp (!) 21   Ht _0  (1.575 m)   LMP 05/03/2012   SpO2 (!) 52%   BMI 37.57 kg/m   Physical Exam Vitals and nursing note reviewed.  Constitutional:      Comments: Unresponsive, lying in bed, bagged via [place]  HENT:     Head:  Normocephalic and atraumatic.     Mouth/Throat:     Comments: Frothy blood noted in airway, King airway intact Eyes:     Comments: Pupils 2 mm equal bilaterally  Neck:     Comments: No deformity Cardiovascular:     Rate and Rhythm: Normal rate.     Comments: Weak femoral pulses Pulmonary:     Comments: Bilateral breath sounds present via bagging via King airway, profound crackles noted Abdominal:     General: Abdomen is flat.     Palpations: There is no mass.  Musculoskeletal:        General: No deformity or signs of injury.  Skin:  Comments: Cool, dry, delayed cap refill  Neurological:     Comments: Unresponsive, GCS 3 T, some spontaneous respirations but no meaningful verbal or eye response response to painful stimuli, intubated via [place] airway     ED Results / Procedures / Treatments   Labs (all labs ordered are listed, but only abnormal results are displayed) Labs Reviewed  LACTIC ACID, PLASMA - Abnormal; Notable for the following components:      Result Value   Lactic Acid, Venous 7.7 (*)    All other components within normal limits  CBC WITH DIFFERENTIAL/PLATELET - Abnormal; Notable for the following components:   WBC 47.9 (*)    RBC 6.47 (*)    HCT 51.8 (*)    MCH 21.9 (*)    MCHC 27.4 (*)    RDW 22.9 (*)    Platelets 519 (*)    nRBC 2.0 (*)    Neutro Abs 35.9 (*)    Lymphs Abs 5.9 (*)    Monocytes Absolute 1.8 (*)    Basophils Absolute 0.3 (*)    Abs Immature Granulocytes 3.85 (*)    All other components within normal limits  PROTIME-INR - Abnormal; Notable for the following components:   Prothrombin Time 17.9 (*)    INR 1.5 (*)    All other components within normal limits  APTT - Abnormal; Notable for the following components:   aPTT 41 (*)    All other components within normal limits  BRAIN NATRIURETIC PEPTIDE - Abnormal; Notable for the following components:   B Natriuretic Peptide 485.9 (*)    All other components within normal limits  I-STAT  CHEM 8, ED - Abnormal; Notable for the following components:   BUN 34 (*)    Creatinine, Ser 1.40 (*)    Glucose, Bld 288 (*)    Calcium, Ion 1.10 (*)    TCO2 21 (*)    Hemoglobin 17.7 (*)    HCT 52.0 (*)    All other components within normal limits  I-STAT ARTERIAL BLOOD GAS, ED - Abnormal; Notable for the following components:   pH, Arterial 6.930 (*)    pCO2 arterial 93.3 (*)    pO2, Arterial 67 (*)    Bicarbonate 19.6 (*)    Acid-base deficit 15.0 (*)    HCT 48.0 (*)    Hemoglobin 16.3 (*)    All other components within normal limits  CULTURE, BLOOD (ROUTINE X 2)  CULTURE, BLOOD (ROUTINE X 2)  URINE CULTURE  SARS CORONAVIRUS 2 BY RT PCR (HOSPITAL ORDER, [place] [facility])  LACTIC ACID, PLASMA  COMPREHENSIVE METABOLIC PANEL  URINALYSIS, ROUTINE W REFLEX MICROSCOPIC  LIPASE, BLOOD  BLOOD GAS, ARTERIAL  BLOOD GAS, ARTERIAL  TROPONIN I (HIGH SENSITIVITY)  TROPONIN I (HIGH SENSITIVITY)    EKG EKG Interpretation  Date/Time:  Tuesday March 03 2020 22:24:48 EDT Ventricular Rate:  116 PR Interval:    QRS Duration: 96 QT Interval:  299 QTC Calculation: 416 R Axis:   171 Text Interpretation: Sinus tachycardia Anterolateral infarct, recent Minimal ST elevation, inferior leads no acute stemi Confirmed by Madalyn Rob [phone]) on 03/08/2020 10:48:23 PM Also confirmed by Madalyn Rob [phone])  on 03/10/2020 10:48:41 PM   Radiology DG Chest Portable 1 View  Result Date: 02/20/2020 CLINICAL DATA:  Shortness of breath hypoxia EXAM: PORTABLE CHEST 1 VIEW COMPARISON:  02/21/2020, 02/20/2020 FINDINGS: Difficult visualization of the carina. Suspect that endotracheal tube tip is about 2 cm superior to general region of carina. Esophageal  tube tip is below the diaphragm but incompletely visualized. Progression of consolidative changes in the right mid to lower lung. Diffuse bilateral reticular infiltrates with increasing ground-glass infiltrate in the left  perihilar region. Mild cardiomegaly. No pneumothorax. Probable trace right pleural effusion. IMPRESSION: 1. Endotracheal tube tip estimated to be about 2 cm superior to carina 2. Worsening of bilateral right greater than left airspace disease. Electronically Signed   By: Donavan Foil M.D.   On: 03/04/2020 23:23   DG Chest Port 1 View  Addendum Date: 02/18/2020   ADDENDUM REPORT: 02/22/2020 22:30 ADDENDUM: These results were called by telephone at the time of interpretation on 02/18/2020 at 10:30 pm to provider [facility] , who verbally acknowledged these results. Electronically Signed   By: Fidela Salisbury MD   On: 02/29/2020 22:30   Result Date: 02/28/2020 CLINICAL DATA:  Cardiopulmonary arrest EXAM: PORTABLE CHEST 1 VIEW COMPARISON:  None. FINDINGS: The endotracheal tube is at the carina position toward the right mainstem bronchus. Nasogastric tube extends into the upper abdomen beyond the margin of the examination. The lungs are symmetrically expanded. There is asymmetric reticular infiltrate diffusely through the right lung and to a lesser extent within the left perihilar and upper lung zones likely representing asymmetric pulmonary edema. Note that pulmonary hemorrhage or aspiration could appear similarly in the acute setting. No pneumothorax or pleural effusion. Cardiac size within normal limits. There are acute fractures of the right sixth and seventh ribs posteriorly. IMPRESSION: 1. The endotracheal tube is at the carina position toward the right mainstem bronchus. Withdrawal by 2 cm may more optimally position the tube. 2. Asymmetric reticular infiltrate through the right lung and to a lesser extent within the left perihilar and upper lung zones likely representing asymmetric pulmonary edema. Note that pulmonary hemorrhage or aspiration could similarly in the acute setting. Electronically Signed: By: Fidela Salisbury MD On: 02/22/2020 22:16    Procedures .Critical Care Performed by: Lucrezia Starch, MD Authorized by: Lucrezia Starch, MD   Critical care provider statement:    Critical care time (minutes):  41   Critical care was necessary to treat or prevent imminent or life-threatening deterioration of the following conditions:  Cardiac failure and respiratory failure   Critical care was time spent personally by me on the following activities:  Discussions with consultants, evaluation of patient's response to treatment, examination of patient, ordering and performing treatments and interventions, ordering and review of laboratory studies, ordering and review of radiographic studies, pulse oximetry, re-evaluation of patient's condition, obtaining history from patient or surrogate and review of old charts Procedure Name: Intubation Date/Time: 03/01/2020 11:44 PM Performed by: Lucrezia Starch, MD Pre-anesthesia Checklist: Patient identified, Patient being monitored, Emergency Drugs available, Timeout performed and Suction available Oxygen Delivery Method: Ambu bag Preoxygenation: unable to obtain pulse ox. Induction Type: Rapid sequence Laryngoscope Size: Glidescope and 3 Grade View: Grade II Tube size: 7.5 mm Number of attempts: 1 Airway Equipment and Method: Rigid stylet Placement Confirmation: ETT inserted through vocal cords under direct vision Secured at: 23 cm Tube secured with: ETT holder Difficulty Due To: Difficulty was anticipated Comments: Difficulty obtaining pulse oximetry both before and after intubation, large blood noted in posterior oropharynx during intubation, able to obtain good view of cords after suctioning and successfully intubated on one attempt, initially secured at 23 cm at the lip but this slipped down further and was later retracted after CXR    CPR  Date/Time: 03/06/2020 11:46 PM Performed by: Roslynn Amble,  Ellwood Dense, MD Authorized by: Lucrezia Starch, MD  CPR Procedure Details:      Amount of time prior to administration of ACLS/BLS  (minutes):  5   ACLS/BLS initiated by EMS: Yes     CPR/ACLS performed in the ED: Yes     Duration of CPR (minutes):  2   Outcome: ROSC obtained    CPR performed via ACLS guidelines under my direct supervision.  See RN documentation for details including defibrillator use, medications, doses and timing. Comments:     Brief CPR, 1 round of epi, initial pulse check, pulses present   (including critical care time)  Medications Ordered in ED Medications  ceFEPIme (MAXIPIME) 2 g in sodium chloride 0.9 % 100 mL IVPB (has no administration in time range)  EPINEPHrine (ADRENALIN) 4 mg in NS 250 mL (0.016 mg/mL) premix infusion (20 mcg/min Intravenous Rate/Dose Change 02/23/2020 2208)  amiodarone (NEXTERONE PREMIX) 360-4.14 MG/200ML-% (1.8 mg/mL) IV infusion (60 mg/hr Intravenous New Bag/Given 02/09/2020 2219)  amiodarone (NEXTERONE PREMIX) 360-4.14 MG/200ML-% (1.8 mg/mL) IV infusion (has no administration in time range)  vancomycin (VANCOREADY) IVPB 2000 mg/400 mL (has no administration in time range)  midazolam (VERSED) injection 4 mg (4 mg Intravenous Given 02/16/2020 2142)  etomidate (AMIDATE) injection (15 mg Intravenous Given 02/15/2020 2151)  succinylcholine (ANECTINE) injection (100 mg Intravenous Given 03/02/2020 2151)  EPINEPHrine (ADRENALIN) 1 MG/10ML injection (1 mg Intravenous Given 02/27/2020 2209)  EPINEPhrine 10 mcg/mL Adult IV Push Syringe (For Blood Pressure Support) (40 mcg Intravenous Given 03/04/2020 2159)  amiodarone (CORDARONE) 150 mg in dextrose 5 % 100 mL bolus (150 mg Intravenous New Bag/Given 02/17/2020 2218)    ED Course  I have reviewed the triage vital signs and the nursing notes.  Pertinent labs & imaging results that were available during my care of the patient were reviewed by me and considered in my medical decision making (see chart for details).  Clinical Course as of Mar 03 2348  Tue Mar 03, 2020  2312 D/w Margaree Mackintosh they will accept   [RD]  2321 Critical care team at bedside    [RD]    Clinical Course User Index [RD] Lucrezia Starch, MD   MDM Rules/Calculators/A&P                          64 year old lady medical history for COPD, pulmonary hypertension, chronic respiratory failure, dementia, blindness presents to ER as postarrest.  Recent admission for pneumonia.  Per report, patient having respiratory symptoms before she went unresponsive.  EMS estimated 5 minutes of CPR prior to arrival.  On arrival here, [facility] airway was intact, pulses present, after IV access obtained, intubated successfully.  Noted tip of ET tube at level of carina, this was subsequently retracted 2 cm.  Patient had bradycardia arrest, brief CPR in ER, approximately 2 minutes, 1 epi.  Started on epi drip.  Later, patient then had a wide-complex tachyarrhythmia concerning for possible V. tach, had a pulse at the time and patient was spontaneously going in and out of this tachyarrhythmia.  Started on amiodarone and patient remained out of this tachyarrhythmia.  Throughout patient's stay in ER both before and after intubation, difficulty with obtaining pulse oximetry.  Profound difficulty oxygenating.  Critical care came to bedside and assumed care of patient.  Broad differential diagnosis at this time - sepsis, COPD, heart failure, ARDS, etc.  Regardless of underlying etiology, prognosis is extremely guarded given her current clinical condition despite  resuscitative efforts.  Met with patient's sister who reports she has POA, she is understanding of her sister's grave condition, wishes to continue with full code for now but is open to additional discussions.  Final Clinical Impression(s) / ED Diagnoses Final diagnoses:  Hypoxia  Acute on chronic respiratory failure with hypoxia and hypercapnia (HCC)  Cardiac arrest (HCC)  Hemoptysis  Leukocytosis, unspecified type  Elevated lactic acid level    Rx / DC Orders ED Discharge Orders    None       Lucrezia Starch, MD 02/29/2020 2349

## 2020-03-03 NOTE — TOC Transition Note (Signed)
Transition of Care [facility]) - CM/SW Discharge Note   Patient Details  Name: Kristen Dennis MRN: [id number] Date of Birth: April 30, 1956  [facility] CM/SW Contact:  Leone Haven, RN Phone Number: 2020/03/28, 9:08 AM   Clinical Narrative:    Patient is for dc today, she states her sister will transport her home today, she already has home oxygen 2 liters.  Per pt eval rec rollator for patient and HHPT, she is active with [facility] for HHPT and will continue.  She states she is not sure she wants rollator,but would like to look at it first.  NCM made referral to [facility] with [employer] ,she will bring rollator up to patient's room for her to see if she wants it or not.    Final next level of care: Home w Home Health Services Barriers to Discharge: No Barriers Identified   Patient Goals and CMS Choice Patient states their goals for this hospitalization and ongoing recovery are:: get better CMS Medicare.gov Compare Post Acute Care list provided to:: Patient Choice offered to / list presented to : Patient  Discharge Placement                       Discharge Plan and Services                DME Arranged: Walker rolling with seat DME Agency: [employer] Date DME Agency Contacted: 03/28/2020 Time DME Agency Contacted: 2330 Representative spoke with at DME Agency: [facility]ia HH Arranged: PT HH Agency: [facility] Date [facility] Agency Contacted: 03-28-20 Time HH Agency Contacted: 0908 Representative spoke with at [facility] Agency: zach  Social Determinants of Health (SDOH) Interventions     Readmission Risk Interventions No flowsheet data found.

## 2020-03-03 NOTE — Code Documentation (Signed)
Dr. Stevie Kern intubating at bedside, 22 @ lip

## 2020-03-04 ENCOUNTER — Encounter (HOSPITAL_COMMUNITY): Payer: Self-pay | Admitting: Specialist

## 2020-03-04 DIAGNOSIS — I469 Cardiac arrest, cause unspecified: Secondary | ICD-10-CM | POA: Diagnosis present

## 2020-03-04 DIAGNOSIS — J9621 Acute and chronic respiratory failure with hypoxia: Secondary | ICD-10-CM | POA: Diagnosis present

## 2020-03-04 DIAGNOSIS — E875 Hyperkalemia: Secondary | ICD-10-CM | POA: Diagnosis present

## 2020-03-04 DIAGNOSIS — J13 Pneumonia due to Streptococcus pneumoniae: Secondary | ICD-10-CM | POA: Diagnosis present

## 2020-03-04 DIAGNOSIS — N179 Acute kidney failure, unspecified: Secondary | ICD-10-CM

## 2020-03-04 DIAGNOSIS — E1165 Type 2 diabetes mellitus with hyperglycemia: Secondary | ICD-10-CM | POA: Diagnosis present

## 2020-03-04 DIAGNOSIS — J440 Chronic obstructive pulmonary disease with acute lower respiratory infection: Secondary | ICD-10-CM | POA: Diagnosis present

## 2020-03-04 DIAGNOSIS — I272 Pulmonary hypertension, unspecified: Secondary | ICD-10-CM | POA: Diagnosis present

## 2020-03-04 DIAGNOSIS — G931 Anoxic brain damage, not elsewhere classified: Secondary | ICD-10-CM | POA: Diagnosis present

## 2020-03-04 DIAGNOSIS — I50813 Acute on chronic right heart failure: Secondary | ICD-10-CM | POA: Diagnosis present

## 2020-03-04 DIAGNOSIS — R34 Anuria and oliguria: Secondary | ICD-10-CM | POA: Diagnosis not present

## 2020-03-04 DIAGNOSIS — Z9911 Dependence on respirator [ventilator] status: Secondary | ICD-10-CM

## 2020-03-04 DIAGNOSIS — J9622 Acute and chronic respiratory failure with hypercapnia: Secondary | ICD-10-CM | POA: Insufficient documentation

## 2020-03-04 DIAGNOSIS — R579 Shock, unspecified: Secondary | ICD-10-CM

## 2020-03-04 DIAGNOSIS — Z20822 Contact with and (suspected) exposure to covid-19: Secondary | ICD-10-CM | POA: Diagnosis present

## 2020-03-04 DIAGNOSIS — E874 Mixed disorder of acid-base balance: Secondary | ICD-10-CM | POA: Diagnosis present

## 2020-03-04 DIAGNOSIS — R7989 Other specified abnormal findings of blood chemistry: Secondary | ICD-10-CM | POA: Diagnosis not present

## 2020-03-04 DIAGNOSIS — Z9981 Dependence on supplemental oxygen: Secondary | ICD-10-CM | POA: Diagnosis not present

## 2020-03-04 DIAGNOSIS — I248 Other forms of acute ischemic heart disease: Secondary | ICD-10-CM | POA: Diagnosis present

## 2020-03-04 DIAGNOSIS — I472 Ventricular tachycardia: Secondary | ICD-10-CM | POA: Diagnosis present

## 2020-03-04 DIAGNOSIS — F0390 Unspecified dementia without behavioral disturbance: Secondary | ICD-10-CM | POA: Diagnosis present

## 2020-03-04 DIAGNOSIS — A403 Sepsis due to Streptococcus pneumoniae: Secondary | ICD-10-CM | POA: Diagnosis present

## 2020-03-04 DIAGNOSIS — K7200 Acute and subacute hepatic failure without coma: Secondary | ICD-10-CM | POA: Diagnosis present

## 2020-03-04 DIAGNOSIS — Z66 Do not resuscitate: Secondary | ICD-10-CM | POA: Diagnosis not present

## 2020-03-04 DIAGNOSIS — J441 Chronic obstructive pulmonary disease with (acute) exacerbation: Secondary | ICD-10-CM | POA: Diagnosis present

## 2020-03-04 DIAGNOSIS — E11649 Type 2 diabetes mellitus with hypoglycemia without coma: Secondary | ICD-10-CM | POA: Diagnosis not present

## 2020-03-04 DIAGNOSIS — R6521 Severe sepsis with septic shock: Secondary | ICD-10-CM | POA: Diagnosis present

## 2020-03-04 DIAGNOSIS — I468 Cardiac arrest due to other underlying condition: Secondary | ICD-10-CM | POA: Diagnosis present

## 2020-03-04 DIAGNOSIS — J9601 Acute respiratory failure with hypoxia: Secondary | ICD-10-CM | POA: Diagnosis not present

## 2020-03-04 LAB — COMPREHENSIVE METABOLIC PANEL
ALT: 254 U/L — ABNORMAL HIGH (ref 0–44)
AST: 845 U/L — ABNORMAL HIGH (ref 15–41)
Albumin: 2 g/dL — ABNORMAL LOW (ref 3.5–5.0)
Alkaline Phosphatase: 217 U/L — ABNORMAL HIGH (ref 38–126)
Anion gap: 19 — ABNORMAL HIGH (ref 5–15)
BUN: 26 mg/dL — ABNORMAL HIGH (ref 8–23)
CO2: 16 mmol/L — ABNORMAL LOW (ref 22–32)
Calcium: 8.6 mg/dL — ABNORMAL LOW (ref 8.9–10.3)
Chloride: 106 mmol/L (ref 98–111)
Creatinine, Ser: 1.5 mg/dL — ABNORMAL HIGH (ref 0.44–1.00)
GFR calc Af Amer: 42 mL/min — ABNORMAL LOW (ref >60)
GFR calc non Af Amer: 36 mL/min — ABNORMAL LOW (ref >60)
Glucose, Bld: 301 mg/dL — ABNORMAL HIGH (ref 70–99)
Potassium: 4.7 mmol/L (ref 3.5–5.1)
Sodium: 141 mmol/L (ref 135–145)
Total Bilirubin: 0.6 mg/dL (ref 0.3–1.2)
Total Protein: 5.8 g/dL — ABNORMAL LOW (ref 6.5–8.1)

## 2020-03-04 LAB — MAGNESIUM: Magnesium: 2 mg/dL (ref 1.7–2.4)

## 2020-03-04 LAB — GLUCOSE, CAPILLARY
Glucose-Capillary: 246 mg/dL — ABNORMAL HIGH (ref 70–99)
Glucose-Capillary: 224 mg/dL — ABNORMAL HIGH (ref 70–99)
Glucose-Capillary: 193 mg/dL — ABNORMAL HIGH (ref 70–99)
Glucose-Capillary: 151 mg/dL — ABNORMAL HIGH (ref 70–99)

## 2020-03-04 LAB — CBC
HCT: 51.1 % — ABNORMAL HIGH (ref 36.0–46.0)
Hemoglobin: 14.1 g/dL (ref 12.0–15.0)
MCH: 22.2 pg — ABNORMAL LOW (ref 26.0–34.0)
MCHC: 27.6 g/dL — ABNORMAL LOW (ref 30.0–36.0)
MCV: 80.5 fL (ref 80.0–100.0)
Platelets: 402 10*3/uL — ABNORMAL HIGH (ref 150–400)
RBC: 6.35 MIL/uL — ABNORMAL HIGH (ref 3.87–5.11)
RDW: 23.4 % — ABNORMAL HIGH (ref 11.5–15.5)
WBC: 67.5 10*3/uL (ref 4.0–10.5)
nRBC: 4.3 % — ABNORMAL HIGH (ref 0.0–0.2)

## 2020-03-04 LAB — BASIC METABOLIC PANEL
Anion gap: 14 (ref 5–15)
BUN: 28 mg/dL — ABNORMAL HIGH (ref 8–23)
CO2: 17 mmol/L — ABNORMAL LOW (ref 22–32)
Calcium: 7.8 mg/dL — ABNORMAL LOW (ref 8.9–10.3)
Chloride: 109 mmol/L (ref 98–111)
Creatinine, Ser: 1.85 mg/dL — ABNORMAL HIGH (ref 0.44–1.00)
GFR calc Af Amer: 33 mL/min — ABNORMAL LOW (ref >60)
GFR calc non Af Amer: 28 mL/min — ABNORMAL LOW (ref >60)
Glucose, Bld: 320 mg/dL — ABNORMAL HIGH (ref 70–99)
Potassium: 5.5 mmol/L — ABNORMAL HIGH (ref 3.5–5.1)
Sodium: 140 mmol/L (ref 135–145)

## 2020-03-04 LAB — PHOSPHORUS: Phosphorus: 8.7 mg/dL — ABNORMAL HIGH (ref 2.5–4.6)

## 2020-03-04 LAB — TROPONIN I (HIGH SENSITIVITY)
Troponin I (High Sensitivity): 426 ng/L (ref <18)
Troponin I (High Sensitivity): 1601 ng/L (ref <18)

## 2020-03-04 LAB — LIPASE, BLOOD: Lipase: 66 U/L — ABNORMAL HIGH (ref 11–51)

## 2020-03-04 LAB — CULTURE, BLOOD (ROUTINE X 2): Culture: NO GROWTH

## 2020-03-04 LAB — LACTIC ACID, PLASMA: Lactic Acid, Venous: 6.2 mmol/L (ref 0.5–1.9)

## 2020-03-04 LAB — PATHOLOGIST SMEAR REVIEW

## 2020-03-04 LAB — SARS CORONAVIRUS 2 BY RT PCR (HOSPITAL ORDER, PERFORMED IN ~~LOC~~ HOSPITAL LAB): SARS Coronavirus 2: NEGATIVE

## 2020-03-04 MED ORDER — VANCOMYCIN HCL IN DEXTROSE 1-5 GM/200ML-% IV SOLN
1000.0000 mg | INTRAVENOUS | Status: DC
  Administered 2020-03-05 – 2020-03-07 (×3): 1000 mg via INTRAVENOUS
  Filled 2020-03-04 (×3): qty 200

## 2020-03-04 MED ORDER — CHLORHEXIDINE GLUCONATE CLOTH 2 % EX PADS
6.0000 | MEDICATED_PAD | Freq: Every day | CUTANEOUS | Status: DC
  Administered 2020-03-04 – 2020-03-06 (×2): 6 via TOPICAL

## 2020-03-04 MED ORDER — INSULIN ASPART 100 UNIT/ML ~~LOC~~ SOLN
0.0000 [IU] | SUBCUTANEOUS | Status: DC
  Administered 2020-03-04: 3 [IU] via SUBCUTANEOUS
  Administered 2020-03-04: 11 [IU] via SUBCUTANEOUS
  Administered 2020-03-04: 5 [IU] via SUBCUTANEOUS
  Administered 2020-03-04: 3 [IU] via SUBCUTANEOUS
  Administered 2020-03-04: 5 [IU] via SUBCUTANEOUS

## 2020-03-04 MED ORDER — BUDESONIDE 0.5 MG/2ML IN SUSP
0.5000 mg | Freq: Two times a day (BID) | RESPIRATORY_TRACT | Status: DC
  Administered 2020-03-04 – 2020-03-06 (×6): 0.5 mg via RESPIRATORY_TRACT
  Filled 2020-03-04 (×8): qty 2

## 2020-03-04 MED ORDER — SODIUM CHLORIDE 0.9 % IV SOLN
2.0000 g | INTRAVENOUS | Status: DC
  Filled 2020-03-04: qty 2

## 2020-03-04 MED ORDER — PIPERACILLIN-TAZOBACTAM IN DEX 2-0.25 GM/50ML IV SOLN
2.2500 g | Freq: Three times a day (TID) | INTRAVENOUS | Status: DC
  Administered 2020-03-04 – 2020-03-06 (×8): 2.25 g via INTRAVENOUS
  Filled 2020-03-04 (×11): qty 50

## 2020-03-04 MED ORDER — PIPERACILLIN-TAZOBACTAM 3.375 G IVPB: 3.3750 g | Freq: Three times a day (TID) | INTRAVENOUS | Status: DC

## 2020-03-04 MED ORDER — CHLORHEXIDINE GLUCONATE 0.12% ORAL RINSE (MEDLINE KIT)
15.0000 mL | Freq: Two times a day (BID) | OROMUCOSAL | Status: DC
  Administered 2020-03-04 – 2020-03-06 (×6): 15 mL via OROMUCOSAL

## 2020-03-04 MED ORDER — HYDROCORTISONE NA SUCCINATE PF 100 MG IJ SOLR
50.0000 mg | Freq: Four times a day (QID) | INTRAMUSCULAR | Status: DC
  Administered 2020-03-04 – 2020-03-07 (×12): 50 mg via INTRAVENOUS
  Filled 2020-03-04 (×12): qty 2

## 2020-03-04 MED ORDER — SODIUM CHLORIDE 0.9 % IV SOLN
2.0000 g | Freq: Two times a day (BID) | INTRAVENOUS | Status: DC
  Filled 2020-03-04: qty 2

## 2020-03-04 MED ORDER — DOCUSATE SODIUM 100 MG PO CAPS: 100.0000 mg | ORAL_CAPSULE | Freq: Two times a day (BID) | ORAL | Status: DC | PRN

## 2020-03-04 MED ORDER — PANTOPRAZOLE SODIUM 40 MG IV SOLR
40.0000 mg | Freq: Every day | INTRAVENOUS | Status: DC
  Administered 2020-03-04 – 2020-03-06 (×3): 40 mg via INTRAVENOUS
  Filled 2020-03-04 (×3): qty 40

## 2020-03-04 MED ORDER — VANCOMYCIN HCL IN DEXTROSE 1-5 GM/200ML-% IV SOLN: 1000.0000 mg | INTRAVENOUS | Status: DC

## 2020-03-04 MED ORDER — POLYETHYLENE GLYCOL 3350 17 G PO PACK: 17.0000 g | PACK | Freq: Every day | ORAL | Status: DC | PRN

## 2020-03-04 MED ORDER — ORAL CARE MOUTH RINSE
15.0000 mL | OROMUCOSAL | Status: DC
  Administered 2020-03-04 – 2020-03-07 (×28): 15 mL via OROMUCOSAL

## 2020-03-04 MED ORDER — VANCOMYCIN HCL 1250 MG/250ML IV SOLN: 1250.0000 mg | INTRAVENOUS | Status: DC

## 2020-03-04 MED FILL — Medication: Qty: 1 | Status: AC

## 2020-03-04 NOTE — Progress Notes (Signed)
Pharmacy Antibiotic Note  Kristen Dennis is a 64 y.o. female admitted on 02/24/2020 with pneumonia.  Pharmacy has been consulted for Vancomycin and Cefepime dosing. Noted recent admission with treatment for strep CAP.  Plan: Cefepime 2gm IV 12 Vancomycin 2gm IV now then 1250 mg IV Q 24 hrs. Will f/u renal function, micro data, and pt's clinical condition Vanc levels prn   Height: 5\' 2"  (157.5 cm) IBW/kg (Calculated) : 50.1  Temp (24hrs), Avg:97.8 F (36.6 C), Min:94.4 F (34.7 C), Max:98 F (36.7 C)  Recent Labs  Lab 02/28/20 2200 02/28/20 2316 02/29/20 0037 02/29/20 0158 02/29/20 0601 03/01/20 0619 03/02/20 0657 02/21/2020 2205 02/13/2020 2340  WBC 11.5*  --   --   --  8.8 11.0* 14.0* 47.9*  --   CREATININE 1.82*   < >  --    < > 1.50* 1.18* 1.01* 1.50* 1.40*  LATICACIDVEN 5.7*  --  5.7*  --  2.0*  --   --  7.7*  --    < > = values in this interval not displayed.    Estimated Creatinine Clearance: 42.6 mL/min (A) (by C-G formula based on SCr of 1.4 mg/dL (H)).    No Known Allergies  Antimicrobials this admission: 8/25 Vanc >>  8/25 Cefepime >>   Microbiology results: 8/24 BCx:  8/25 UCx:   8/25 Trach asp:  Thank you for allowing pharmacy to be a part of this patient's care.  9/25, PharmD, BCPS Please see amion for complete clinical pharmacist phone list 03/04/2020 12:40 AM

## 2020-03-04 NOTE — Progress Notes (Signed)
PCCM Interval Progress Note  Prepped for CVL placement; however, prior to insertion, pt had worsening bradycardia to 40's and hypoxia to 20's.  No pulses palpable, PEA arrest code blue called.  CPR began and 33m epi administered before ROSC.  I met with family including two sisters, son, best friend.  I brought them to the pt's room so that they can be at the bedside and witness how sick Mrs. Shartzer was.  I had an extensive discussion with them regarding Mrs. HGucurrent circumstances and organ failures. We also discussed patient's prior wishes under circumstances such as this. The family has decided not to perform resuscitation if arrest were to occur, but to otherwise continue with current medical support / therapies.  Family has opted for CVL to be deferred given pt's unstable state.  I explained risk of vasopressor infiltration etc while using PIV and family accepts this.  Grave prognosis given with probable death within next few hours.  Emotional support offered.  Total time discussing with family almost 1 hour.   RMontey Hora PSan LucasPulmonary & Critical Care Medicine 03/04/2020, 12:43 AM

## 2020-03-04 NOTE — Progress Notes (Signed)
RT note- No changes at this time.

## 2020-03-04 NOTE — Progress Notes (Signed)
[name], MRN:  [id number], DOB:  [birth year], LOS: 0 ADMISSION DATE:  March 28, 2020, CONSULTATION DATE:  03/04/2020 REFERRING MD:  EDP CHIEF COMPLAINT:  S/p cardiac arrest   Brief History   64 year old female with PMHx of COPD on 3L oxygen at baseline, pulmonary hypertension with RV failure, dementia and blindness admitted on 8/21 with strep pneumonia and early sepsis and discharged on 8/24 to home. At home, she had PEA arrest with CPR and intubated. In the ED, patient had second PEA arrest and also again prior to central line insertion attempt. Per discussions with family, no further escalation of care at this time.   Past Medical History   Past Medical History:  Diagnosis Date  . Asthma   . Diabetes mellitus without complication (HCC)   . Encephalitis   . Hypertension   . Legally blind   . Renal disease   . Stroke [facility])    2011   Significant Hospital Events   8/25> admitted to ICU s/p intubation following cardiac arrest  Consults:  PCCM  Procedures:  ETT 8/25 >   Significant Diagnostic Tests:    Micro Data:  Previous streptococcal antigen in urine positive on recent admision  Antimicrobials:  Vancomycin 8/25> Zosyn 8/25>  Interim history/subjective:  Patient admitted overnight s/p PEA arrest. Suffered second PEA arrest prior to CVL placement for which given CPR and epinephrine before ROSC.  Ongoing goals of care discussion with family.   Objective   Blood pressure (!) 83/57, pulse (!) 48, temperature 99.5 F (37.5 C), resp. rate (!) 28, height 5\' 2"  (1.575 m), weight 95.9 kg, last menstrual period 05/03/2012, SpO2 (!) 89 %.    Vent Mode: PRVC FiO2 (%):  [100 %] 100 % Set Rate:  [18 bmp-24 bmp] 24 bmp Vt Set:  [400 mL] 400 mL PEEP:  [5 cmH20-14 cmH20] 14 cmH20 Plateau Pressure:  [28 cmH20-31 cmH20] 31 cmH20   Intake/Output Summary (Last 24 hours) at 03/04/2020 0746 Last data filed at 03/04/2020 0700 Gross per 24 hour  Intake 783.44 ml  Output 0 ml  Net  783.44 ml   Filed Weights   03/04/20 0300  Weight: 95.9 kg    Examination: General: obese female, intubated and unresponsive to verbal or painful stimuli HENT: normocephalic/atraumatic; PERRL, ETT in place Lungs: coarse bilateral breath sounds  Cardiovascular: RRR, S1 and S2 present, no m/r/g Abdomen: nondistended, soft, +bowel sounds Extremities: cool to touch, dry, no edema noted  Neuro: unresponsive to verbal and painful stimuli; PERRL, no dolls eyes; no corneal reflex; + cough, gag reflex  Skin: no rash or lesion noted  Resolved Hospital Problem list    Assessment & Plan:  Acute anoxic encephalopathy s/p PEA arrest: Remains unresponsive to verbal and painful stimuli. Given multiple PEA arrests, likely has significant anoxic brain injury.  - Continue to monitor and optimize MAP >65   Acute on chronic hypoxic and hypercapnic respiratory failure  History of COPD on 3L O2 at home and multiple recent admissions for heart failure and COPD exacerbations, and most recently, pneumonia. Currently requiring full ventilator support.  - Continue full vent support with TV 4-8cc/kg - Wean FiO2 as able to maintain SpO2 >90%  Septic shock secondary to presumed strep pneumonia Recent admission for pneumonia. Currently requiring vasopressor support with epinephrine. Significant leukocytosis and lactic acidosis  - Continue vasopressor support for MAP >65 - Continue vancomycin and zosyn   Anion gap metabolic acidosis secondary to lactic acidosis: Acute on chronic respiratory acidosis:  S/p cardiac arrest and chronic hypercarbic respiratory failure   Acute renal failure with hyperkalemia: Secondary to shock in setting of multiple PEA arrests  - Optimize MAP >65 for adequate renal perfusion - Strict I&O - Avoid nephrotoxic agents as able  Shock liver: In setting of PEA arrest  - Trend LFTs, PT/INR  Demand ischemia:  Elevated troponins in setting of multiple PEA arrests - Continue to  monitor   GOC discussion: Ongoing goals of discussion with sister at bedside. Patient is currently DNR and no further escalation of care. Discussed  That she is currently on maximum life support and with withdrawal of life support, patient would naturally pass away. Family is considering comfort care measures at this time.   Best practice:  Diet: NPO Pain/Anxiety/Delirium protocol (if indicated): per protocol VAP protocol (if indicated): per protocol DVT prophylaxis: Lovenox/SCD GI prophylaxis: PPI Glucose control: SSI Mobility: Bed Rest Code Status: DNR Family Communication: sister and granddaughter updated at bedside Disposition: ICU  Critical care time: 24 minutes    Eliezer Bottom, MD Internal Medicine, PGY-2 03/04/20 12:25 PM Pager # [phone]

## 2020-03-04 NOTE — Procedures (Signed)
Chaplain sat with son Kristen Dennis, sister, and another woman who said she was the patient's caregiver.  Son departed and sister Defanie? Was contacted and is on the way.  Chaplain offered prayer and scripture bedside with sister and caregiver present. Continuing to offer support. Rev. Lynnell Chad Pager [phone]

## 2020-03-04 NOTE — Progress Notes (Signed)
Pharmacy Antibiotic Note  Kristen Dennis is a 64 y.o. female admitted on 03-12-2020 with pneumonia after just being discharged from hospital.  Pharmacy was consulted for Vancomycin and Cefepime dosing. Noted recent admission with treatment for strep CAP.  Currently tmax of 99, wbc 47 last night am labs pending. Pharmacy called by nursing overnight d/t incompatibility of antibiotics and pressors. Will change cefepime to zosyn to avoid interaction with epinephrine.   Rn reports patient making no uop currently.   Plan: Change cefepime to zosyn 2.25g IV q8 hours (could also use ceftriaxone if needed/warranted) Vancomycin decreased to 1g q24 hour dosing Will f/u renal function, micro data, and pt's clinical condition Vanc levels prn - will need random level if plan is to continue >48 hours   Height: 5\' 2"  (157.5 cm) Weight: 95.9 kg (211 lb 6.7 oz) IBW/kg (Calculated) : 50.1  Temp (24hrs), Avg:98.1 F (36.7 C), Min:94.4 F (34.7 C), Max:99.5 F (37.5 C)  Recent Labs  Lab 02/28/20 2200 02/28/20 2316 02/29/20 0037 02/29/20 0158 02/29/20 0601 02/29/20 0601 03/01/20 0619 03/02/20 0657 Mar 12, 2020 2205 March 12, 2020 2340 03/04/20 0333  WBC 11.5*  --   --    < > 8.8  --  11.0* 14.0* 47.9*  --  PENDING  CREATININE 1.82*   < >  --    < > 1.50*   < > 1.18* 1.01* 1.50* 1.40* 1.85*  LATICACIDVEN 5.7*  --  5.7*  --  2.0*  --   --   --  7.7*  --  6.2*   < > = values in this interval not displayed.    Estimated Creatinine Clearance: 32.7 mL/min (A) (by C-G formula based on SCr of 1.85 mg/dL (H)).    No Known Allergies  Antimicrobials this admission: 8/25 Vanc >>  8/25 Cefepime >> 8/25 Zosyn 8/25>>  Microbiology results: 8/24 BCx:  8/25 UCx:   8/25 Trach asp:  Thank you for allowing pharmacy to be a part of this patient's care.  9/25 PharmD., BCPS Clinical Pharmacist 03/04/2020 8:57 AM

## 2020-03-04 NOTE — Progress Notes (Signed)
Pt's sister, Inetta Fermo who is POA informed RN there is a brother who is coming to visit patient. He would like to give pt more time before withdrawing care. MD made aware.

## 2020-03-04 NOTE — Progress Notes (Signed)
Patient transported to 2H01 without complications.  

## 2020-03-04 NOTE — Progress Notes (Signed)
RT attempted to obtain arterial blood gas.  Unable to obtain due to blood pressure being 64/45.  RT will try again later.

## 2020-03-04 NOTE — Progress Notes (Signed)
No belongings brought from ED to 2H with the patient

## 2020-03-04 NOTE — Progress Notes (Signed)
2000- No urine noted since admission. Foley sample port cleaned and flushed.   0300 urine obtained, urine very dark brown/black. Sample sent per MD order. Patient bathed with cool water and ice packs applied due to increasing temp.   7342 eLink called regarding temperature of 102. Awaiting orders.Marland Kitchen

## 2020-03-05 ENCOUNTER — Other Ambulatory Visit: Payer: Self-pay

## 2020-03-05 ENCOUNTER — Inpatient Hospital Stay (HOSPITAL_COMMUNITY): Payer: Medicare PPO

## 2020-03-05 DIAGNOSIS — J9602 Acute respiratory failure with hypercapnia: Secondary | ICD-10-CM

## 2020-03-05 DIAGNOSIS — J9601 Acute respiratory failure with hypoxia: Secondary | ICD-10-CM

## 2020-03-05 LAB — GLUCOSE, CAPILLARY
Glucose-Capillary: 107 mg/dL — ABNORMAL HIGH (ref 70–99)
Glucose-Capillary: 122 mg/dL — ABNORMAL HIGH (ref 70–99)
Glucose-Capillary: 236 mg/dL — ABNORMAL HIGH (ref 70–99)
Glucose-Capillary: 41 mg/dL — CL (ref 70–99)
Glucose-Capillary: 42 mg/dL — CL (ref 70–99)
Glucose-Capillary: 87 mg/dL (ref 70–99)
Glucose-Capillary: 92 mg/dL (ref 70–99)
Glucose-Capillary: 95 mg/dL (ref 70–99)

## 2020-03-05 LAB — CULTURE, BLOOD (ROUTINE X 2)
Culture: NO GROWTH
Special Requests: ADEQUATE

## 2020-03-05 LAB — URINE CULTURE: Culture: NO GROWTH

## 2020-03-05 MED ORDER — DEXTROSE 50 % IV SOLN
INTRAVENOUS | Status: AC
  Filled 2020-03-05: qty 50

## 2020-03-05 MED ORDER — DEXTROSE 50 % IV SOLN
50.0000 mL | Freq: Once | INTRAVENOUS | Status: AC
  Administered 2020-03-05: 50 mL via INTRAVENOUS

## 2020-03-05 MED ORDER — SODIUM ZIRCONIUM CYCLOSILICATE 5 G PO PACK
5.0000 g | PACK | Freq: Once | ORAL | Status: AC
  Administered 2020-03-05: 5 g
  Filled 2020-03-05: qty 1

## 2020-03-05 NOTE — Progress Notes (Signed)
Hypoglycemic Event  CBG: 42  Treatment: D50 50 mL (25 gm)  Symptoms: None  Follow-up CBG: DTOI:7124 CBG Result:122  Possible Reasons for Event: Inadequate meal intake  Comments/MD notified: planning to withdraw support today per pt's POA    Kristen Dennis

## 2020-03-05 NOTE — Progress Notes (Signed)
NAMECristal Dennis, MRN:  [id number], DOB:  1955-11-10, LOS: 1 ADMISSION DATE:  03/06/2020, CONSULTATION DATE:  03/04/2020 REFERRING MD:  EDP CHIEF COMPLAINT:  S/p cardiac arrest   Brief History   64 year old female with PMHx of COPD on 3L oxygen at baseline, pulmonary hypertension with RV failure, dementia and blindness admitted on 8/21 with strep pneumonia and early sepsis and discharged on 8/24 to home. At home, she had PEA arrest with CPR and intubated. In the ED, patient had second PEA arrest and also again prior to central line insertion attempt. Per discussions with family, no further escalation of care at this time.   Past Medical History   Past Medical History:  Diagnosis Date  . Asthma   . Diabetes mellitus without complication (HCC)   . Encephalitis   . Hypertension   . Legally blind   . Renal disease   . Stroke [facility])    2011   Significant Hospital Events   8/25> admitted to ICU s/p intubation following cardiac arrest  Consults:  PCCM  Procedures:  ETT 8/25 >   Significant Diagnostic Tests:    Micro Data:  Previous streptococcal antigen in urine positive on recent admision  Antimicrobials:  Vancomycin 8/25> Zosyn 8/25>  Interim history/subjective:  Remains on peripheral pressors. Sister at bedside. No change in responsiveness. Minimal UOP.  Febrile-cooling blanket in place.  Objective   Blood pressure (!) 98/47, pulse (!) 31, temperature (!) 101.1 F (38.4 C), resp. rate (!) 30, height 5\' 2"  (1.575 m), weight 98.2 kg, last menstrual period 05/03/2012, SpO2 (!) 82 %.    Vent Mode: PRVC FiO2 (%):  [76 %-100 %] 100 % Set Rate:  [24 bmp] 24 bmp Vt Set:  [400 mL] 400 mL PEEP:  [14 cmH20] 14 cmH20 Plateau Pressure:  [17 cmH20-24 cmH20] 20 cmH20   Intake/Output Summary (Last 24 hours) at 03/05/2020 1354 Last data filed at 03/05/2020 1300 Gross per 24 hour  Intake 2212.16 ml  Output 45 ml  Net 2167.16 ml   Filed Weights   03/04/20 0300 03/05/20 0330   Weight: 95.9 kg 98.2 kg    Examination: General: Critically ill-appearing woman lying in bed intubated, unresponsive. HENT: [place]/AT, eyes anicteric.  Scleral edema. Lungs: Coarse rales bilaterally, mild vent dyssynchrony, breathing above the vent Cardiovascular: Tachycardic, regular rhythm.  No murmurs Abdomen: Obese, soft, nontender, nondistended Extremities: No significant peripheral edema Neuro: + PERRL, + tracheal cough. Fixed right and downward gaze deviation. Unable to tell if doll's eyes reflexes. No corneal reflexes.  No response to verbal stimulation or painful stimulation in any extremity. Skin: No rashes or wounds  Resolved Hospital Problem list    Assessment & Plan:  Acute anoxic encephalopathy s/p PEA arrest: 3x PEA arrests, now with physical exam signs to suggest she has significant anoxic brain injury.  -Continue neuroprotective measures-avoid hypocardia, maintain head of bed greater than 30 degrees, optimize electrolytes, maintain euglycemia  Acute on chronic hypoxic and hypercapnic respiratory failure  History of COPD on 3L O2 at home and multiple recent admissions for heart failure and COPD exacerbations, and most recently, pneumonia. Currently requiring full ventilator support.  -Continue low tidal volume ventilation, 4 to 8 cc/kg ideal body weight with goal plateau less than 30 and driving pressure less than 15. -Continue to hold sedation to facilitate neuro prognostication. -Mental status currently precludes extubation.  Oxygen requirements remain too high for SBT. -Wean FiO2 as able to maintain SPO2 greater than 90%   Septic  shock secondary to presumed strep pneumonia Recent admission for pneumia. Currently requiring ongoing vasopressor support with epinephrine. Significant leukocytosis and lactic acidosis.  Sister had wished to pursue no escalation of level of care in the emergency department. - Continue vasopressor support to maintain MAP >65. - Continue  vancomycin and zosyn   Anion gap metabolic acidosis secondary to lactic acidosis: Acute on chronic respiratory acidosis: S/p cardiac arrest and chronic hypercarbic respiratory failure   Acute renal failure with hyperkalemia: Secondary to shock in setting of multiple PEA arrests  - Optimize MAP >65 for adequate renal perfusion - Strict I&O - Avoid nephrotoxic agents as able  Shock liver -Continue to monitor periodically  Elevated troponin due to demand ischemia versus troponin leak from sepsis associated organ dysfunction versus due to chest compressions Elevated troponins in setting of multiple PEA arrests - Continue to monitor periodically  GOC discussion: Discussed goals of care with her sister at bedside today.  She indicated that tomorrow she thinks she will have an answer regarding whether aggressive care should continue, but wishes to continue current care measures.  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  Her brothers are struggling with her prognosis, but are planning to come to visit today.  Best practice:  Diet: NPO Pain/Anxiety/Delirium protocol (if indicated): Holding sedation for neuro prognostication.  Continues analgesia as needed for pain control. VAP protocol (if indicated): per protocol DVT prophylaxis: Lovenox/SCD GI prophylaxis: PPI Glucose control: SSI Mobility: bedrest Code Status: DNR Family Communication: sister updated at bedside Disposition: ICU   This patient is critically ill with multiple organ system failure which requires frequent high complexity decision making, assessment, support, evaluation, and titration of therapies. This was completed through the application of advanced monitoring technologies and extensive interpretation of multiple databases. During this encounter critical care time was devoted to patient care services described in this note for 33 minutes.  Steffanie Dunn, DO 03/05/20 2:55 PM [facility]

## 2020-03-05 NOTE — Progress Notes (Signed)
eLink Physician-Brief Progress Note Patient Name: Kristen Dennis DOB: [birth year] MRN: [id number]   Date of Service  03/05/2020  HPI/Events of Note  Fever to 102.2 F - Already pan cultured and on Vancomycin and Zosyn. AST and ALT both elevated, therefore, no Tylenol. Creatinine - 1.85, therefore, no motrin.   eICU Interventions  Plan: 1. Cooling blanket PRN.      Intervention Category Major Interventions: Other:  [name] 03/05/2020, 6:20 AM

## 2020-03-05 NOTE — Progress Notes (Signed)
Hypoglycemic Event  CBG: 41  Treatment: D50 50 mL (25 gm)  Symptoms: None  Follow-up CBG: Time: 1700 CBG Result:236  Possible Reasons for Event: Inadequate meal intake  Comments/MD notified:plan to withdrawal care when all family arrives    Marcelino Scot

## 2020-03-06 DIAGNOSIS — G931 Anoxic brain damage, not elsewhere classified: Secondary | ICD-10-CM

## 2020-03-06 LAB — GLUCOSE, CAPILLARY
Glucose-Capillary: 117 mg/dL — ABNORMAL HIGH (ref 70–99)
Glucose-Capillary: 36 mg/dL — CL (ref 70–99)
Glucose-Capillary: 62 mg/dL — ABNORMAL LOW (ref 70–99)
Glucose-Capillary: 65 mg/dL — ABNORMAL LOW (ref 70–99)
Glucose-Capillary: 75 mg/dL (ref 70–99)
Glucose-Capillary: 76 mg/dL (ref 70–99)
Glucose-Capillary: 88 mg/dL (ref 70–99)
Glucose-Capillary: 92 mg/dL (ref 70–99)
Glucose-Capillary: 92 mg/dL (ref 70–99)
Glucose-Capillary: 95 mg/dL (ref 70–99)

## 2020-03-06 MED ORDER — DEXTROSE 50 % IV SOLN: 1.0000 | Freq: Once | INTRAVENOUS | Status: AC

## 2020-03-06 MED ORDER — DEXTROSE 50 % IV SOLN
INTRAVENOUS | Status: AC
  Administered 2020-03-06: 50 mL
  Filled 2020-03-06: qty 50

## 2020-03-06 MED ORDER — DEXTROSE 50 % IV SOLN
25.0000 mL | Freq: Once | INTRAVENOUS | Status: AC
  Administered 2020-03-06: 25 mL via INTRAVENOUS
  Filled 2020-03-06: qty 50

## 2020-03-06 MED ORDER — DEXTROSE 50 % IV SOLN
INTRAVENOUS | Status: AC
  Administered 2020-03-06: 50 mL via INTRAVENOUS
  Filled 2020-03-06: qty 50

## 2020-03-06 NOTE — Progress Notes (Signed)
  Hypoglycemic Event  CBG: 65   Treatment: D50 25 mL (12.5 gm)  Symptoms: None  Follow-up CBG: Time:1709 CBG Result:62    1738           92   Possible Reasons for Event: Inadequate meal intake  Comments/MD notified: Dr Chestine Spore made aware and ordered amp of D50.     Marcelino Scot

## 2020-03-06 NOTE — Progress Notes (Signed)
   03/06/20 1500  Clinical Encounter Type  Visited With Patient and family together  Visit Type Spiritual support  Referral From Nurse  Spiritual Encounters  Spiritual Needs Prayer  Stress Factors  Family Stress Factors Exhausted;Major life changes  Advance Directives (For Healthcare)  Does Patient Have a Medical Advance Directive? Unable to assess, patient is non-responsive or altered mental status  Chaplain visited and spoke with patient's sister concerning her state of health. Chaplain offered spiritual care and prayed for the patient and family. Chaplain will follow up as needed.

## 2020-03-06 NOTE — Progress Notes (Signed)
Sister Otila Kluver and 3 brothers met in the room and the request of Otila Kluver in order for the brothers to be able to view the patients current status and make an informed decision regarding plan of care. After meeting it was decided that they would like to proceed with withdrawal of life support measures 8/27 afternoon/evening. Patient remains a DNR with no advancement of support at this time. Sister (MPOA) Otila Kluver remains at the bedside.

## 2020-03-06 NOTE — Plan of Care (Signed)
Brothers not visiting today. Sister Veva Holes has remained at bedside and indicated that tomorrow morning with her sisters and Cortina's son she is planning on terminal withdrawal. I assured her that we will prioritize that she is comfortable at that time.  Steffanie Dunn, DO 03/06/20 6:02 PM [facility]

## 2020-03-06 NOTE — Progress Notes (Signed)
NAMEJamilyn Dennis, MRN:  [id number], DOB:  08-23-1955, LOS: 2 ADMISSION DATE:  03-15-20, CONSULTATION DATE:  03/04/2020 REFERRING MD:  EDP CHIEF COMPLAINT:  S/p cardiac arrest   Brief History   64 year old female with PMHx of COPD on 3L oxygen at baseline, pulmonary hypertension with RV failure, dementia and blindness admitted on 8/21 with strep pneumonia and early sepsis and discharged on 8/24 to home. At home, she had PEA arrest with CPR and intubated. In the ED, patient had second PEA arrest and also again prior to central line insertion attempt. Per discussions with family, no further escalation of care at this time.   Past Medical History   Past Medical History:  Diagnosis Date  . Asthma   . Diabetes mellitus without complication (HCC)   . Encephalitis   . Hypertension   . Legally blind   . Renal disease   . Stroke [facility])    2011   Significant Hospital Events   8/25> admitted to ICU s/p intubation following cardiac arrest  Consults:  PCCM  Procedures:  ETT 8/25 >   Significant Diagnostic Tests:    Micro Data:  Previous streptococcal antigen in urine positive on recent admision  Antimicrobials:  Vancomycin 8/25> Zosyn 8/25>  Interim history/subjective:  Remains on pressors, high vent settings. Brothers coming to visit this afternoon. Sister at bedside.  Objective   Blood pressure (!) 79/25, pulse 79, temperature (!) 100.6 F (38.1 C), resp. rate (!) 26, height 5\' 2"  (1.575 m), weight 98.2 kg, last menstrual period 05/03/2012, SpO2 92 %.    Vent Mode: PRVC FiO2 (%):  [100 %] 100 % Set Rate:  [24 bmp] 24 bmp Vt Set:  [400 mL] 400 mL PEEP:  [14 cmH20] 14 cmH20 Plateau Pressure:  [20 cmH20-28 cmH20] 28 cmH20   Intake/Output Summary (Last 24 hours) at 03/06/2020 0841 Last data filed at 03/06/2020 0730 Gross per 24 hour  Intake 2133.87 ml  Output 0 ml  Net 2133.87 ml   Filed Weights   03/04/20 0300 03/05/20 0330  Weight: 95.9 kg 98.2 kg     Examination: General:  Critically ill appearing woman laying in bed in NAD HENT: [place]/AT,  scleral edema.  Lungs: Faint expiratory wheezing, breathing above the vent with dyssynchrony. Cardiovascular: Tachycardic, regular rhythm Abdomen: Obese, soft, nontender, nondistended Extremities: No significant edema, no clubbing or cyanosis Neuro: +PERRL, fixed eye deviation towards the right, unable to assess for doll's eyes reflex.  Not withdrawing from pain in any extremity.  No response to verbal stimulation.  Negative corneal reflex.  Intact tracheal cough.  Breathing above set rate on the vent Skin: No rashes GU: Minimal very dark urine output  Resolved Hospital Problem list    Assessment & Plan:  Acute anoxic encephalopathy s/p PEA arrest: 3x PEA arrests, now with physical exam signs to suggest she has significant anoxic brain injury.  -Continue neuroprotective measures-normoglycemia, head of bed greater than 30 degrees, optimize electrolytes  Acute on chronic hypoxic and hypercapnic respiratory failure  History of COPD on 3L O2 at home and multiple recent admissions for heart failure and COPD exacerbations, and most recently, pneumonia. Currently requiring full ventilator support.  -Continue LTV V, 4-8 cc/kg ideal body weight.  Mental status currently precludes extubation -Goal plateau's and 30 driving pressure less than 15.  Meeting goals.  Not meeting saturation goals  Septic shock secondary to presumed strep pneumonia Recent admission for pneumia. Currently requiring ongoing vasopressor support with epinephrine. Significant leukocytosis and  lactic acidosis.  Sister had wished to pursue no escalation of level of care in the emergency department. -Continue vasopressors to maintain MAP greater than 65 -Continue empiric antibiotics  Anion gap metabolic acidosis secondary to lactic acidosis: Acute on chronic respiratory acidosis: S/p cardiac arrest and chronic hypercarbic respiratory  failure  -No follow-up labs given family's decision not to escalate care  Acute renal failure with hyperkalemia, oliguria Secondary to shock in setting of multiple PEA arrests  -Continue renal protective measures and adequate perfusion -Strict I's/O -Avoid nephrotoxic meds and renally dose medications  Shock liver -No intervention indicated as family does not wish to escalate care  Elevated troponin due to demand ischemia versus troponin leak from sepsis associated organ dysfunction versus due to chest compressions Elevated troponins in setting of multiple PEA arrests  GOC discussion: Discussed goals of care at bedside with her sister today.  Her brothers are coming this afternoon and she is planning on withdrawing aggressive care measures.  Best practice:  Diet: NPO Pain/Anxiety/Delirium protocol (if indicated): Holding sedation for neuro prognostication.  Continues analgesia as needed for pain control. VAP protocol (if indicated): per protocol DVT prophylaxis: Lovenox/SCD GI prophylaxis: PPI Glucose control: SSI Mobility: bedrest Code Status: DNR Family Communication: sister updated at bedside 8/27 Disposition: ICU   This patient is critically ill with multiple organ system failure which requires frequent high complexity decision making, assessment, support, evaluation, and titration of therapies. This was completed through the application of advanced monitoring technologies and extensive interpretation of multiple databases. During this encounter critical care time was devoted to patient care services described in this note for 34 minutes.  Steffanie Dunn, DO 03/06/20 3:49 PM [facility]

## 2020-03-07 LAB — GLUCOSE, CAPILLARY
Glucose-Capillary: 228 mg/dL — ABNORMAL HIGH (ref 70–99)
Glucose-Capillary: 28 mg/dL — CL (ref 70–99)
Glucose-Capillary: 40 mg/dL — CL (ref 70–99)
Glucose-Capillary: 44 mg/dL — CL (ref 70–99)
Glucose-Capillary: 46 mg/dL — ABNORMAL LOW (ref 70–99)

## 2020-03-07 MED ORDER — DEXTROSE 50 % IV SOLN
INTRAVENOUS | Status: AC
  Administered 2020-03-07: 25 g via INTRAVENOUS
  Filled 2020-03-07: qty 50

## 2020-03-07 MED ORDER — DEXTROSE 50 % IV SOLN: 25.0000 g | INTRAVENOUS | Status: AC

## 2020-03-07 MED ORDER — DEXTROSE 10 % IV SOLN: INTRAVENOUS | Status: DC

## 2020-03-07 MED ORDER — DEXTROSE 50 % IV SOLN
INTRAVENOUS | Status: AC
  Administered 2020-03-07: 25 g via INTRAVENOUS
  Filled 2020-03-07: qty 100

## 2020-03-07 MED ORDER — DEXTROSE 50 % IV SOLN
25.0000 g | INTRAVENOUS | Status: AC
  Administered 2020-03-07: 25 g via INTRAVENOUS

## 2020-03-09 LAB — CULTURE, BLOOD (ROUTINE X 2)
Culture: NO GROWTH
Culture: NO GROWTH
Special Requests: ADEQUATE

## 2020-03-11 NOTE — H&P (Signed)
[facility]/[place] Referral:  Hold for possible tissue donation. Referral number [id number] Kristen Dennis.   Kristen Fedorko DNP eLink RN

## 2020-03-11 NOTE — Death Summary Note (Signed)
  Pt became bradycardic around 0432 this morning. I entered the room, woke the pts sister, and let her know the patient was declining. We sat at the beside and the pt passed. Myself and Farley Ly pronounced at [phone]. The pts sister notified other family members who are now at the beside as well. [facility]S was notified.

## 2020-03-11 NOTE — Progress Notes (Signed)
eLink Physician-Brief Progress Note Patient Name: Kristen Dennis DOB: March 21, 1956 MRN: [id number]   Date of Service  03/02/2020  HPI/Events of Note  Patient with recurrent hypoglycemia.  eICU Interventions  D 10 % W infusion ordered at 50 ml / hour.        Thomasene Lot Terence Bart 03/08/2020, 3:11 AM

## 2020-03-11 NOTE — Discharge Summary (Signed)
Physician Discharge Summary  Patient ID: Kristen Dennis MRN: [id number] DOB/AGE: Mar 14, 1956 64 y.o.  Admit date: 02/27/2020 Discharge date: 03/09/2020  Admission Diagnoses: Cardiac arrest- PEA Acute on chronic hypoxic and hypercapnic respiratory failure Acute COPD exacerbation Acute on chronic right heart failure Anoxic encephalopathy AKI with oliguria Hyperkalemia Septic shock due to Strep pneumoniae pneumonia  Lactic acidosis Hypoglycemia Shock liver  Discharge Diagnoses:  Active Problems:   Cardiac arrest [facility]) Cardiac arrest- PEA Acute on chronic hypoxic and hypercapnic respiratory failure Acute COPD exacerbation Acute on chronic right heart failure Anoxic encephalopathy AKI with oliguria Hyperkalemia Septic shock due to Strep pneumoniae pneumonia  Lactic acidosis Hypoglycemia Shock liver  Discharged Condition: deceased  Hospital Course: [name] was admitted to the hospital after 3 cardiac arrests with refractory shock and severe hypoxic respiratory failure despite mechanical ventilation and vasopressors. Her family at bedside (sister, POA) decided not to escalate care further given her poor prognosis and instability. Despite antibiotics, vasopressors, supportive care, MV, she continued to have progressive multiorgan failure, including oliguric renal failure. She had family visiting with her throughout her stay until she passed away on 2020/04/02 at 04:43 with her sister at bedside.  Consults: pulmonary/intensive care  Significant Diagnostic Studies: Blood cultures: negative sars-coV2 negative CXR: diffuse bilateral infiltrates, pleural effusion on the right  Treatments:  Mechanical ventilation Bronchodilators Antibiotics Vasopressors    Disposition: funeral home of her family's choosing     Signed: Steffanie Dunn 03/09/2020, 11:01 AM

## 2020-03-11 DEATH — deceased

## 2020-03-12 NOTE — Discharge Summary (Signed)
Physician Discharge Summary  Kristen Dennis BCW:[id number] DOB: [birth year] DOA: 02/28/2020  PCP: Claudean Severance, MD  Admit date: 02/28/2020 Discharge date: 05-Mar-2020  Time spent: 35 minutes  Recommendations for Outpatient Follow-up:  1. PCP in 1 week, please check BMP at FU 2. Continue goals of care discussions, poor prognosis 3. [place] [facility], Dr. Everardo All in 2 to 3 weeks   Discharge Diagnoses:  Principal Problem:   Acute respiratory failure with hypoxia (HCC) Active Problems:   COPD (chronic obstructive pulmonary disease) (HCC)   Dementia (HCC)   ARF (acute renal failure) (HCC)   CAP (community acquired pneumonia)   Sepsis (HCC) COPD exacerbation RV failure Cor pulmonale Moderate pulmonary hypertension Dementia Blindness  Discharge Condition: Stable  Diet recommendation: Diabetic  Filed Weights   03/01/20 0500 03/02/20 0336 03-05-20 0428  Weight: 90.3 kg 92.4 kg 93.2 kg    History of present illness:  64 year old chronically ill female with history of COPD/chronic resp failure onO2-3L, Chronic diastolic CHF RV failure, dementia, blindness, lives at home with her sister was brought to the ED with progressive shortness of breath for 2 weeks. -In the ED she was initially hypotensive, resolved with gentle IV fluids,, chest x-ray noted interstitial findings concerning for possible pneumonia, temp was 101, lactic acid was 5.  Hospital Course:    Sepsis Acute on chronic hypoxic respiratory failure Community-acquired pneumonia COPD exacerbation -Clinically improving, sepsis was present on admission -treated with ceftriaxone and azithromycin , afebrile, clinically improving -Blood cultures are negative, transitioned to prednisone and oral cefdinir -SLP evaluation completed, no overt aspiration noted -Ambulating with PT OT, weaned O2 down to 3 L which is her baseline -Also followed by pulmonary this admission, recommended outpatient follow-up post discharge due to chronic  respiratory failure and cor pulmonale/pulmonary hypertension  Acute on chronic diastolic CHF Moderate pulmonary hypertension RV failure/cor pulmonale -Diuresed briefly with IV Lasix now appears euvolemic -Continue oral Lasix  Acute renal failure -In the setting of sepsis and ACE inhibitor -Creatinine 2.0 on admission, baseline is 1.2 -Improving, lisinopril resumed at discharge  Uncontrolled type 2 diabetes mellitus with hyperglycemia -Continue current regimen of Lantus and meal coverage -Titrate down as prednisone tapered  Dementia Blindness -Continue Aricept -Lives with her sister who provides care, declines SNF  Discharge Exam: Vitals:   Mar 05, 2020 0428 2020/03/05 0748  BP: (!) 162/96   Pulse: 60   Resp: 15   Temp: 98 F (36.7 C)   SpO2: 95% 93%    General: Awake alert oriented to self and only partly to place, cognitive deficits and blind Cardiovascular: S1-S2, regular rate rhythm Respiratory: Decreased breath sounds to bases, otherwise clear  Discharge Instructions   Discharge Instructions    Diet - low sodium heart healthy   Complete by: As directed    Diet Carb Modified   Complete by: As directed    Increase activity slowly   Complete by: As directed      Allergies as of 05-Mar-2020   No Known Allergies     Medication List    STOP taking these medications   amLODipine 5 MG tablet Commonly known as: NORVASC     TAKE these medications   acetaminophen 500 MG tablet Commonly known as: TYLENOL Take 500-1,000 mg by mouth every 6 (six) hours as needed for mild pain or headache.   albuterol 108 (90 Base) MCG/ACT inhaler Commonly known as: VENTOLIN HFA Inhale 2 puffs into the lungs every 6 (six) hours as needed for wheezing or shortness of breath.  benzonatate 100 MG capsule Commonly known as: TESSALON Take 100 mg by mouth 3 (three) times daily as needed for cough.   Breo Ellipta 100-25 MCG/INH Aepb Generic drug: fluticasone  furoate-vilanterol Inhale 1 puff into the lungs daily.   citalopram 10 MG tablet Commonly known as: CELEXA Take 10 mg by mouth at bedtime.   donepezil 5 MG tablet Commonly known as: ARICEPT Take 5 mg by mouth at bedtime.   DULoxetine 60 MG capsule Commonly known as: CYMBALTA Take 60 mg by mouth at bedtime.   ergocalciferol 1.25 MG (50000 UT) capsule Commonly known as: VITAMIN D2 Take 50,000 Units by mouth every Monday.   furosemide 40 MG tablet Commonly known as: LASIX Take 40 mg by mouth in the morning.   hydrALAZINE 50 MG tablet Commonly known as: APRESOLINE Take 50 mg by mouth 3 (three) times daily.   Januvia 25 MG tablet Generic drug: sitaGLIPtin Take 25 mg by mouth daily.   Lantus SoloStar 100 UNIT/ML Solostar Pen Generic drug: insulin glargine Inject 50 Units into the skin at bedtime.   lisinopril 10 MG tablet Commonly known as: ZESTRIL Take 10 mg by mouth every evening.   LORazepam 0.5 MG tablet Commonly known as: ATIVAN Take 0.5 mg by mouth daily as needed for anxiety.   metFORMIN 500 MG tablet Commonly known as: GLUCOPHAGE Take 1,000 mg by mouth 2 (two) times daily.   metoprolol tartrate 50 MG tablet Commonly known as: LOPRESSOR Take 50 mg by mouth 2 (two) times daily.   montelukast 10 MG tablet Commonly known as: SINGULAIR Take 10 mg by mouth at bedtime.   Mucinex Fast-Max DM Max 5-100 MG/5ML Liqd Generic drug: Dextromethorphan-guaiFENesin Take 5-10 mLs by mouth every 4 (four) hours as needed (for coughing).   omeprazole 20 MG capsule Commonly known as: PRILOSEC Take 20 mg by mouth daily before supper.   potassium chloride SA 20 MEQ tablet Commonly known as: KLOR-CON Take 20 mEq by mouth in the morning.   predniSONE 10 MG tablet Commonly known as: DELTASONE Take 1-4 tablets (10-40 mg total) by mouth daily with breakfast. 40mg  daily for 2days then 30mg  daily for 2days then 20mg  daily for 2days, 10mg  daily for 2days, then STOP What  changed:   how much to take  how to take this  when to take this  additional instructions   pregabalin 100 MG capsule Commonly known as: Lyrica Take 1 capsule (100 mg total) by mouth 3 (three) times daily.     ASK your doctor about these medications   cefdinir 300 MG capsule Commonly known as: OMNICEF Take 1 capsule (300 mg total) by mouth every 12 (twelve) hours for 4 days. Ask about: Should I take this medication?      No Known Allergies  Follow-up Information    , MD Follow up.   Specialty: Pulmonary Disease Why: Please contact our office to arrange follow-up Contact information: [address] [place] [phone]        [place], MD. Go on 03/10/2020.   Specialty: Family Medicine Why: @[id number] [id number] information: [address] [place] 03/12/2020 [phone]        [facility] Follow up.   Specialty: Home Health Services Why: HHPT Contact information: [address] [place] [address] [phone]                The results of significant diagnostics from this hospitalization (including imaging, microbiology, ancillary  and laboratory) are listed below for reference.    Significant Diagnostic Studies: DG Chest 2 View  Result Date: 02/20/2020 CLINICAL DATA:  Abnormal physical exam, crackles, dyspnea EXAM: CHEST - 2 VIEW COMPARISON:  01/18/2020 FINDINGS: The lungs are symmetrically expanded. There are patchy mid and lower lung zone ground-glass pulmonary infiltrates possibly representing mild pulmonary edema or diffuse infectious infiltrate.l there is a superimposed 10 mm pulmonary nodule within the left mid lung zone which was normal not well seen on prior examination and may be inflammatory in nature. No pneumothorax. Tiny bilateral pleural effusions are present. Cardiac size within normal limits. The central pulmonary arteries are enlarged suggesting changes of  pulmonary arterial hypertension. No acute bone abnormality. IMPRESSION: 1. Patchy mid and lower lung zone ground-glass pulmonary infiltrates possibly representing mild pulmonary edema or diffuse infectious infiltrate. 2. Tiny bilateral pleural effusions. 3. Pulmonary arterial hypertension. Electronically Signed   By: Helyn Numbers MD   On: 02/20/2020 23:55   DG Chest Port 1 View  Result Date: 03/05/2020 CLINICAL DATA:  Respiratory failure. EXAM: PORTABLE CHEST 1 VIEW COMPARISON:  03-27-20. FINDINGS: Endotracheal tube, NG tube in stable position. Heart size normal. Diffuse bilateral pulmonary infiltrates/edema again noted. Moderate right pleural effusion noted on today's exam. No pneumothorax. IMPRESSION: 1.  Endotracheal tube, NG tube in stable position. 2. Diffuse bilateral pulmonary infiltrates/edema again noted. Moderate right pleural effusion noted on today's exam. Electronically Signed   By: Maisie Fus  Register   On: 03/05/2020 07:54   DG Chest Portable 1 View  Result Date: 03/27/20 CLINICAL DATA:  Shortness of breath hypoxia EXAM: PORTABLE CHEST 1 VIEW COMPARISON:  03/27/2020, 02/20/2020 FINDINGS: Difficult visualization of the carina. Suspect that endotracheal tube tip is about 2 cm superior to general region of carina. Esophageal tube tip is below the diaphragm but incompletely visualized. Progression of consolidative changes in the right mid to lower lung. Diffuse bilateral reticular infiltrates with increasing ground-glass infiltrate in the left perihilar region. Mild cardiomegaly. No pneumothorax. Probable trace right pleural effusion. IMPRESSION: 1. Endotracheal tube tip estimated to be about 2 cm superior to carina 2. Worsening of bilateral right greater than left airspace disease. Electronically Signed   By: Jasmine Pang M.D.   On: 2020-03-27 23:23   DG Chest Port 1 View  Addendum Date: 03/27/2020   ADDENDUM REPORT: Mar 27, 2020 22:30 ADDENDUM: These results were called by telephone at the  time of interpretation on 27-Mar-2020 at 10:30 pm to provider [facility] , who verbally acknowledged these results. Electronically Signed   By: Helyn Numbers MD   On: 03-27-20 22:30   Result Date: 03/27/20 CLINICAL DATA:  Cardiopulmonary arrest EXAM: PORTABLE CHEST 1 VIEW COMPARISON:  None. FINDINGS: The endotracheal tube is at the carina position toward the right mainstem bronchus. Nasogastric tube extends into the upper abdomen beyond the margin of the examination. The lungs are symmetrically expanded. There is asymmetric reticular infiltrate diffusely through the right lung and to a lesser extent within the left perihilar and upper lung zones likely representing asymmetric pulmonary edema. Note that pulmonary hemorrhage or aspiration could appear similarly in the acute setting. No pneumothorax or pleural effusion. Cardiac size within normal limits. There are acute fractures of the right sixth and seventh ribs posteriorly. IMPRESSION: 1. The endotracheal tube is at the carina position toward the right mainstem bronchus. Withdrawal by 2 cm may more optimally position the tube. 2. Asymmetric reticular infiltrate through the right lung and to a lesser extent within the left perihilar and upper lung  zones likely representing asymmetric pulmonary edema. Note that pulmonary hemorrhage or aspiration could similarly in the acute setting. Electronically Signed: By: Helyn NumbersAshesh  Parikh MD On: 04/18/2020 22:16   DG CHEST PORT 1 VIEW  Result Date: 03/01/2020 CLINICAL DATA:  COPD EXAM: PORTABLE CHEST 1 VIEW COMPARISON:  02/28/2020 chest radiograph. FINDINGS: Stable cardiomediastinal silhouette with mild cardiomegaly. No pneumothorax. Probable stable trace bilateral pleural effusions. Persistent patchy hazy and linear diffuse parahilar opacities, most prominent in the lower lungs, not substantially changed. IMPRESSION: 1. Stable mild cardiomegaly with diffuse patchy hazy and linear parahilar opacities, most prominent  in the lower lungs. Differential remains pulmonary edema or atypical infection. 2. Probable stable trace bilateral pleural effusions. Electronically Signed   By: Delbert PhenixJason A Poff M.D.   On: 03/01/2020 16:33   DG Chest Portable 1 View  Result Date: 02/28/2020 CLINICAL DATA:  Shortness of breath. EXAM: PORTABLE CHEST 1 VIEW COMPARISON:  February 20, 2020 FINDINGS: Mild diffusely increased interstitial lung markings are seen. Mild areas of atelectasis and/or infiltrate are noted within the bilateral lung bases. There is no evidence of a pleural effusion or pneumothorax. The heart size and mediastinal contours are within normal limits. There is mild calcification of the aortic arch. The visualized skeletal structures are unremarkable. IMPRESSION: Mild diffusely increased interstitial lung markings with mild bibasilar atelectasis and/or infiltrate. Electronically Signed   By: Aram Candelahaddeus  Houston M.D.   On: 02/28/2020 22:11    Microbiology: Recent Results (from the past 240 hour(s))  Blood Culture (routine x 2)     Status: None   Collection Time: 2020-07-10 12:35 AM   Specimen: BLOOD RIGHT ARM  Result Value Ref Range Status   Specimen Description BLOOD RIGHT ARM  Final   Special Requests   Final    BOTTLES DRAWN AEROBIC AND ANAEROBIC Blood Culture adequate volume   Culture   Final    NO GROWTH 5 DAYS Performed at [facility], [address] [place], [place] [address]    Report Status 03/09/2020 FINAL  Final  SARS Coronavirus 2 by RT PCR (hospital order, performed in [facility] hospital lab) Nasopharyngeal Nasopharyngeal Swab     Status: None   Collection Time: 2020-07-10 10:00 PM   Specimen: Nasopharyngeal Swab  Result Value Ref Range Status   SARS Coronavirus 2 NEGATIVE NEGATIVE Final    Comment: (NOTE) SARS-CoV-2 target nucleic acids are NOT DETECTED.  The SARS-CoV-2 RNA is generally detectable in upper and lower respiratory specimens during the acute phase of infection. The  lowest concentration of SARS-CoV-2 viral copies this assay can detect is 250 copies / mL. A negative result does not preclude SARS-CoV-2 infection and should not be used as the sole basis for treatment or other patient management decisions.  A negative result may occur with improper specimen collection / handling, submission of specimen other than nasopharyngeal swab, presence of viral mutation(s) within the areas targeted by this assay, and inadequate number of viral copies (<250 copies / mL). A negative result must be combined with clinical observations, patient history, and epidemiological information.  Fact Sheet for Patients:   BoilerBrush.com.cyhttps://www.fda.gov/media/136312/download  Fact Sheet for Healthcare Providers: https://pope.com/https://www.fda.gov/media/136313/download  This test is not yet approved or  cleared by the [place] FDA and has been authorized for detection and/or diagnosis of SARS-CoV-2 by FDA under an Emergency Use Authorization (EUA).  This EUA will remain in effect (meaning this test can be used) for the duration of the COVID-19 declaration under Section 564(b)(1) of the Act, 21 U.S.C.  section 360bbb-3(b)(1), unless the authorization is terminated or revoked sooner.  Performed at [facility], [address], [place], [place] [address]   Blood Culture (routine x 2)     Status: None   Collection Time: 03/04/20  3:34 AM   Specimen: BLOOD RIGHT FOREARM  Result Value Ref Range Status   Specimen Description BLOOD RIGHT FOREARM  Final   Special Requests   Final    BOTTLES DRAWN AEROBIC ONLY Blood Culture results may not be optimal due to an inadequate volume of blood received in culture bottles   Culture   Final    NO GROWTH 5 DAYS Performed at [facility], [address] [place], [place] [address]    Report Status 03/09/2020 FINAL  Final  Urine culture     Status: None   Collection Time: 03/05/20  3:02 AM   Specimen: In/Out Cath Urine  Result Value Ref Range  Status   Specimen Description IN/OUT CATH URINE  Final   Special Requests NONE  Final   Culture   Final    NO GROWTH Performed at [facility], [address], [place], [place] [address]    Report Status 03/05/2020 FINAL  Final     Labs: Basic Metabolic Panel: No results for input(s): NA, K, CL, CO2, GLUCOSE, BUN, CREATININE, CALCIUM, MG, PHOS in the last 168 hours. Liver Function Tests: No results for input(s): AST, ALT, ALKPHOS, BILITOT, PROT, ALBUMIN in the last 168 hours. No results for input(s): LIPASE, AMYLASE in the last 168 hours. No results for input(s): AMMONIA in the last 168 hours. CBC: No results for input(s): WBC, NEUTROABS, HGB, HCT, MCV, PLT in the last 168 hours. Cardiac Enzymes: No results for input(s): CKTOTAL, CKMB, CKMBINDEX, TROPONINI in the last 168 hours. BNP: BNP (last 3 results) Recent Labs    01/18/20 2120 02/28/20 2138 02/23/2020 2207  BNP 598.0* 982.9* 485.9*    ProBNP (last 3 results) No results for input(s): PROBNP in the last 8760 hours.  CBG: Recent Labs  Lab 02/17/2020 0013 03/06/2020 0049 02/25/2020 0125 02/29/2020 0205 03/06/2020 0247  GLUCAP 28* 44* 40* 46* 228*       Signed:  Zannie Cove MD.  [facility] 03/12/2020, 2:56 PM

## 2022-05-05 IMAGING — DX DG CHEST 1V PORT
1 series · 1 of 1 positions shown · non-contrast
Comparison: 03/03/2020, 02/20/2020

CLINICAL DATA: Shortness of breath hypoxia

EXAM:
PORTABLE CHEST 1 VIEW

[chest ap]
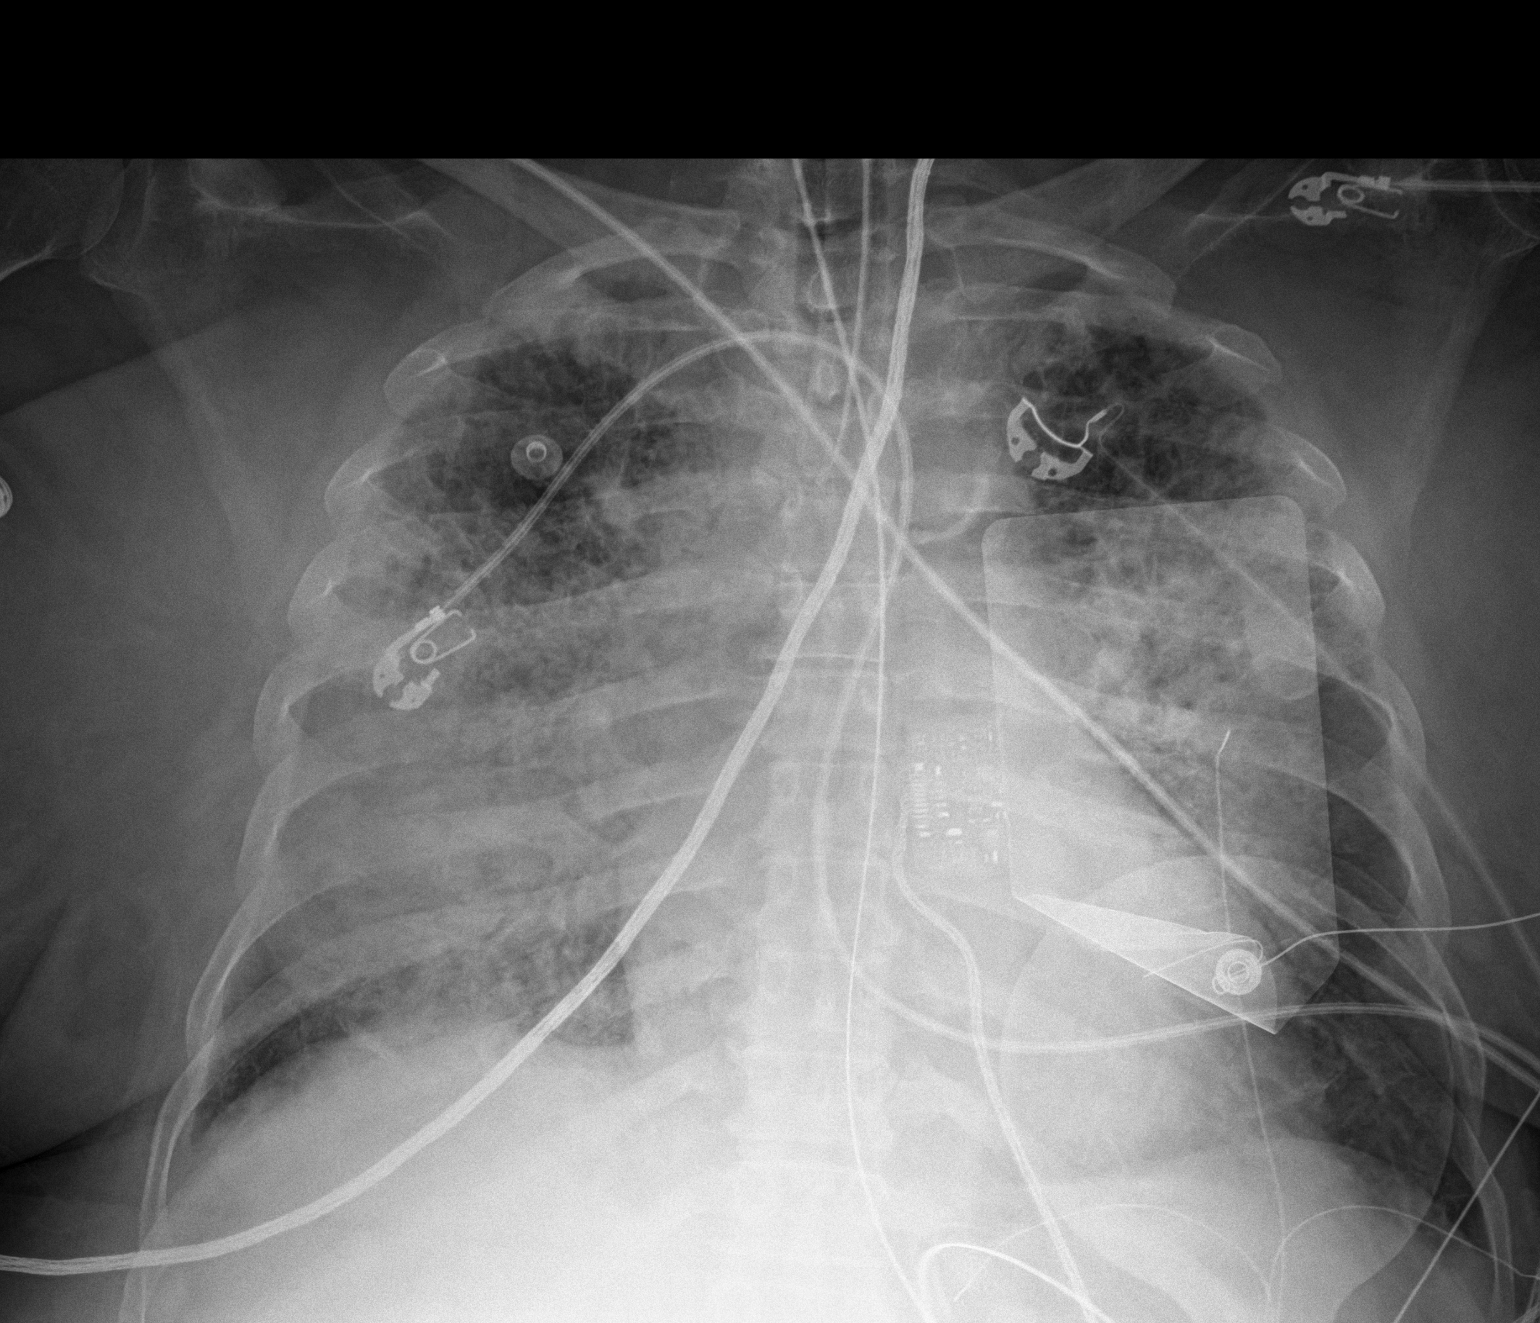

[1 of 1 positions shown; findings below may reference images not displayed]

FINDINGS: Difficult visualization of the carina. Suspect that endotracheal
tube tip is about 2 cm superior to general region of carina.
Esophageal tube tip is below the diaphragm but incompletely
visualized. Progression of consolidative changes in the right mid to
lower lung. Diffuse bilateral reticular infiltrates with increasing
ground-glass infiltrate in the left perihilar region. Mild
cardiomegaly. No pneumothorax. Probable trace right pleural
effusion.
IMPRESSION: 1. Endotracheal tube tip estimated to be about 2 cm superior to
carina
2. Worsening of bilateral right greater than left airspace disease.

## 2022-05-05 IMAGING — DX DG CHEST 1V PORT
1 series · 1 of 1 positions shown · non-contrast
Comparison: None.
COMPARISON: None.

Addendum:
CLINICAL DATA: Cardiopulmonary arrest

EXAM:
PORTABLE CHEST 1 VIEW

[chest]
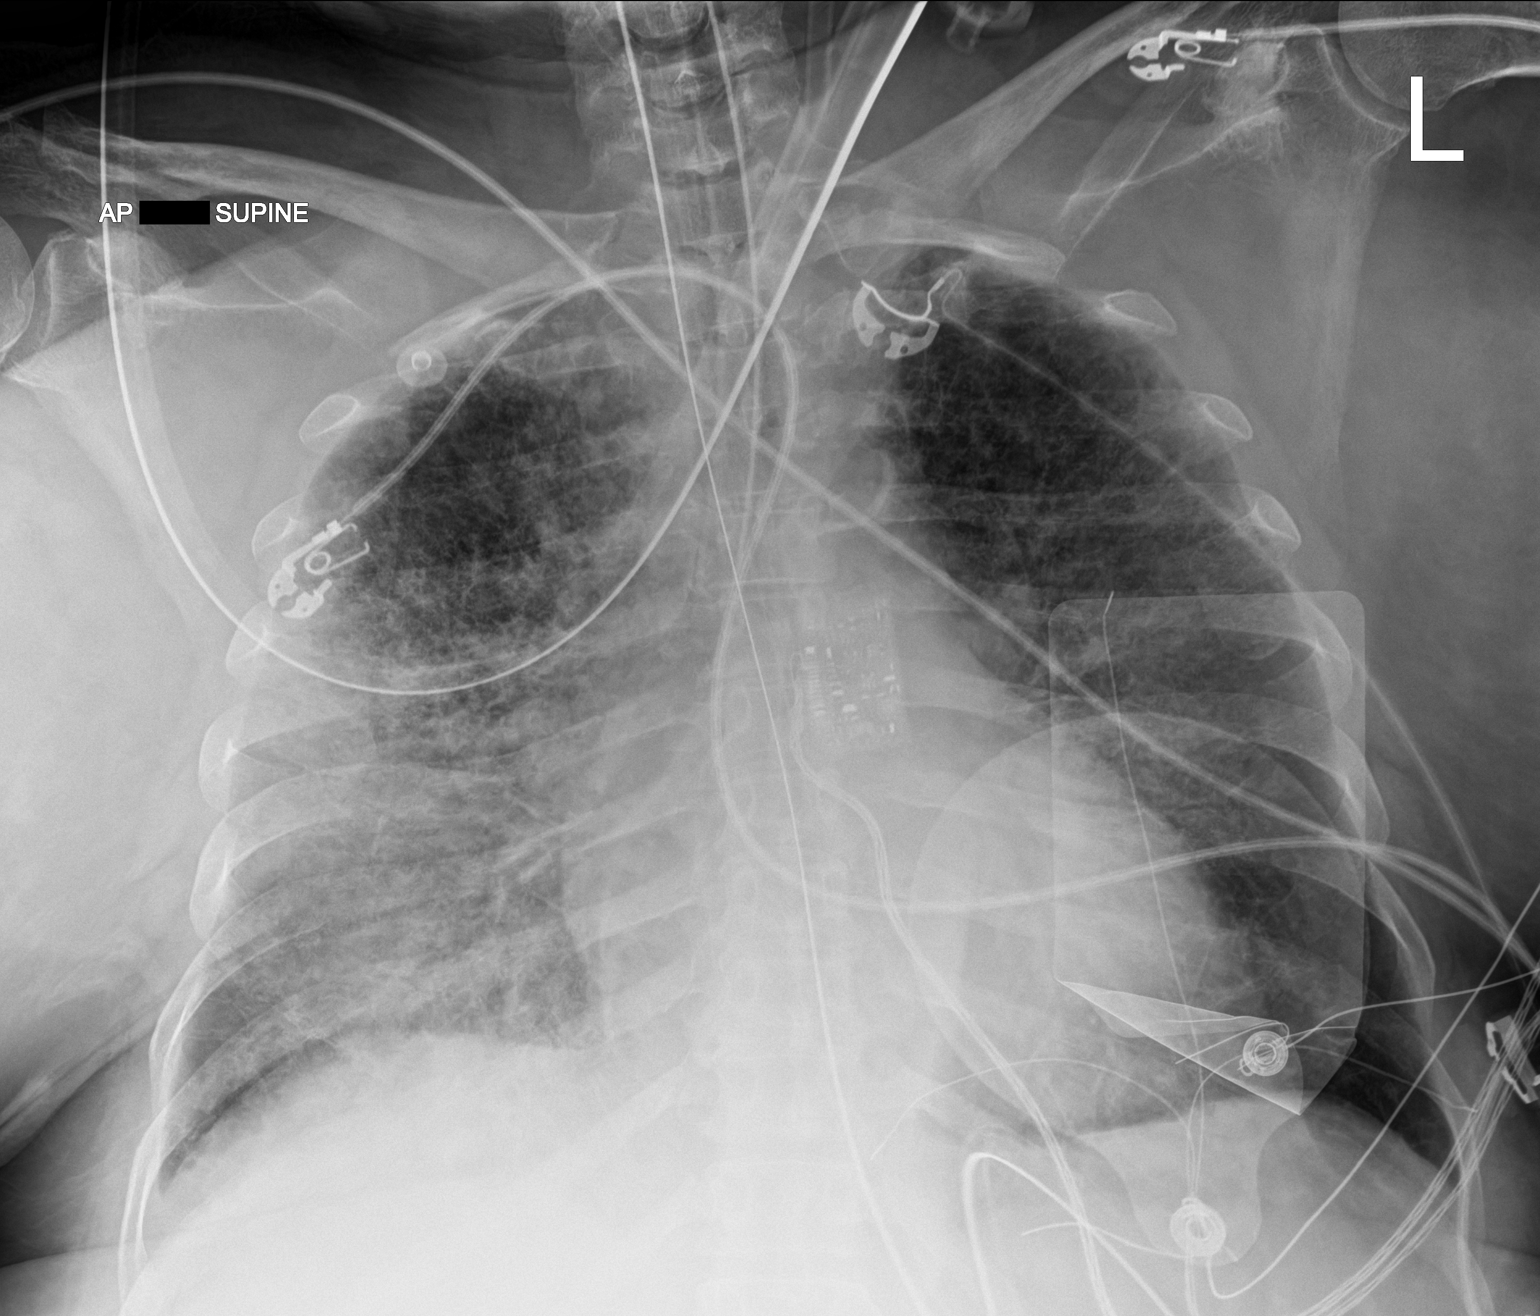

[1 of 1 positions shown; findings below may reference images not displayed]

FINDINGS: The endotracheal tube is at the carina position toward the right
mainstem bronchus.

Nasogastric tube extends into the upper abdomen beyond the margin of
the examination.

The lungs are symmetrically expanded. There is asymmetric reticular
infiltrate diffusely through the right lung and to a lesser extent
within the left perihilar and upper lung zones likely representing
asymmetric pulmonary edema. Note that pulmonary hemorrhage or
aspiration could appear similarly in the acute setting. No
pneumothorax or pleural effusion. Cardiac size within normal limits.
There are acute fractures of the right sixth and seventh ribs
posteriorly.
IMPRESSION: 1. The endotracheal tube is at the carina position toward the right
mainstem bronchus. Withdrawal by 2 cm may more optimally position
the tube.
2. Asymmetric reticular infiltrate through the right lung and to a
lesser extent within the left perihilar and upper lung zones likely
representing asymmetric pulmonary edema. Note that pulmonary
hemorrhage or aspiration could similarly in the acute setting.

ADDENDUM:
These results were called by telephone at the time of interpretation
on 03/03/2020 at [DATE] to provider NEGRI JJ , who verbally
acknowledged these results.

*** End of Addendum ***
FINDINGS: The endotracheal tube is at the carina position toward the right
mainstem bronchus.

Nasogastric tube extends into the upper abdomen beyond the margin of
the examination.

The lungs are symmetrically expanded. There is asymmetric reticular
infiltrate diffusely through the right lung and to a lesser extent
within the left perihilar and upper lung zones likely representing
asymmetric pulmonary edema. Note that pulmonary hemorrhage or
aspiration could appear similarly in the acute setting. No
pneumothorax or pleural effusion. Cardiac size within normal limits.
There are acute fractures of the right sixth and seventh ribs
posteriorly.
IMPRESSION: 1. The endotracheal tube is at the carina position toward the right
mainstem bronchus. Withdrawal by 2 cm may more optimally position
the tube.
2. Asymmetric reticular infiltrate through the right lung and to a
lesser extent within the left perihilar and upper lung zones likely
representing asymmetric pulmonary edema. Note that pulmonary
hemorrhage or aspiration could similarly in the acute setting.
